# Patient Record
Sex: Female | Born: 1960 | Race: White | Hispanic: No | Marital: Single | Smoking: Never smoker
Health system: Southern US, Community
[De-identification: ages and names within clinical notes are randomized; demographics above are authoritative.]

## PROBLEM LIST (undated history)

## (undated) DIAGNOSIS — E669 Obesity, unspecified: Secondary | ICD-10-CM

## (undated) DIAGNOSIS — D219 Benign neoplasm of connective and other soft tissue, unspecified: Secondary | ICD-10-CM

## (undated) DIAGNOSIS — E785 Hyperlipidemia, unspecified: Secondary | ICD-10-CM

## (undated) DIAGNOSIS — R87619 Unspecified abnormal cytological findings in specimens from cervix uteri: Secondary | ICD-10-CM

## (undated) DIAGNOSIS — D649 Anemia, unspecified: Secondary | ICD-10-CM

## (undated) DIAGNOSIS — F419 Anxiety disorder, unspecified: Secondary | ICD-10-CM

## (undated) DIAGNOSIS — E039 Hypothyroidism, unspecified: Secondary | ICD-10-CM

## (undated) DIAGNOSIS — H9192 Unspecified hearing loss, left ear: Secondary | ICD-10-CM

## (undated) DIAGNOSIS — IMO0002 Reserved for concepts with insufficient information to code with codable children: Secondary | ICD-10-CM

## (undated) HISTORY — DX: Reserved for concepts with insufficient information to code with codable children: IMO0002

## (undated) HISTORY — DX: Unspecified abnormal cytological findings in specimens from cervix uteri: R87.619

## (undated) HISTORY — DX: Benign neoplasm of connective and other soft tissue, unspecified: D21.9

## (undated) HISTORY — DX: Hypothyroidism, unspecified: E03.9

## (undated) HISTORY — DX: Hyperlipidemia, unspecified: E78.5

## (undated) HISTORY — DX: Anemia, unspecified: D64.9

## (undated) HISTORY — DX: Unspecified hearing loss, left ear: H91.92

## (undated) HISTORY — DX: Obesity, unspecified: E66.9

## (undated) HISTORY — DX: Anxiety disorder, unspecified: F41.9

---

## 1998-12-13 HISTORY — PX: OTHER SURGICAL HISTORY: SHX169

## 1998-12-29 ENCOUNTER — Other Ambulatory Visit: Admission: RE | Admit: 1998-12-29 | Discharge: 1998-12-29 | Payer: Self-pay | Admitting: Obstetrics and Gynecology

## 2000-04-09 ENCOUNTER — Other Ambulatory Visit: Admission: RE | Admit: 2000-04-09 | Discharge: 2000-04-09 | Payer: Self-pay | Admitting: Obstetrics and Gynecology

## 2001-04-11 ENCOUNTER — Other Ambulatory Visit: Admission: RE | Admit: 2001-04-11 | Discharge: 2001-04-11 | Payer: Self-pay | Admitting: Obstetrics and Gynecology

## 2002-08-10 ENCOUNTER — Other Ambulatory Visit: Admission: RE | Admit: 2002-08-10 | Discharge: 2002-08-10 | Payer: Self-pay | Admitting: Obstetrics and Gynecology

## 2003-10-29 ENCOUNTER — Other Ambulatory Visit: Admission: RE | Admit: 2003-10-29 | Discharge: 2003-10-29 | Payer: Self-pay | Admitting: Obstetrics and Gynecology

## 2004-11-23 ENCOUNTER — Other Ambulatory Visit: Admission: RE | Admit: 2004-11-23 | Discharge: 2004-11-23 | Payer: Self-pay | Admitting: Obstetrics and Gynecology

## 2005-01-09 ENCOUNTER — Ambulatory Visit: Payer: Self-pay | Admitting: Internal Medicine

## 2005-12-28 ENCOUNTER — Other Ambulatory Visit: Admission: RE | Admit: 2005-12-28 | Discharge: 2005-12-28 | Payer: Self-pay | Admitting: Obstetrics & Gynecology

## 2006-01-02 ENCOUNTER — Ambulatory Visit: Payer: Self-pay | Admitting: Internal Medicine

## 2006-01-28 ENCOUNTER — Emergency Department: Payer: Self-pay | Admitting: Emergency Medicine

## 2006-01-29 ENCOUNTER — Ambulatory Visit: Payer: Self-pay | Admitting: Internal Medicine

## 2006-01-29 ENCOUNTER — Inpatient Hospital Stay: Payer: Self-pay | Admitting: Surgery

## 2006-02-10 HISTORY — PX: OTHER SURGICAL HISTORY: SHX169

## 2006-03-07 ENCOUNTER — Ambulatory Visit: Payer: Self-pay | Admitting: Internal Medicine

## 2006-03-08 ENCOUNTER — Ambulatory Visit: Payer: Self-pay | Admitting: Internal Medicine

## 2006-04-17 ENCOUNTER — Ambulatory Visit: Payer: Self-pay | Admitting: Gastroenterology

## 2006-05-08 ENCOUNTER — Ambulatory Visit: Payer: Self-pay | Admitting: Gastroenterology

## 2007-01-24 ENCOUNTER — Other Ambulatory Visit: Admission: RE | Admit: 2007-01-24 | Discharge: 2007-01-24 | Payer: Self-pay | Admitting: Obstetrics & Gynecology

## 2007-02-04 ENCOUNTER — Ambulatory Visit: Payer: Self-pay | Admitting: Internal Medicine

## 2007-02-11 HISTORY — PX: CHOLECYSTECTOMY, LAPAROSCOPIC: SHX56

## 2007-02-11 HISTORY — PX: APPENDECTOMY: SHX54

## 2007-02-23 ENCOUNTER — Emergency Department: Payer: Self-pay

## 2007-02-23 ENCOUNTER — Other Ambulatory Visit: Payer: Self-pay

## 2007-02-24 ENCOUNTER — Ambulatory Visit: Payer: Self-pay | Admitting: Gastroenterology

## 2007-02-28 ENCOUNTER — Ambulatory Visit: Payer: Self-pay | Admitting: Surgery

## 2007-03-04 ENCOUNTER — Ambulatory Visit: Payer: Self-pay | Admitting: Surgery

## 2007-09-16 ENCOUNTER — Ambulatory Visit: Payer: Self-pay | Admitting: Gynecologic Oncology

## 2008-02-26 ENCOUNTER — Other Ambulatory Visit: Admission: RE | Admit: 2008-02-26 | Discharge: 2008-02-26 | Payer: Self-pay | Admitting: Obstetrics & Gynecology

## 2008-02-27 ENCOUNTER — Ambulatory Visit: Payer: Self-pay | Admitting: Internal Medicine

## 2008-03-08 ENCOUNTER — Ambulatory Visit: Payer: Self-pay | Admitting: Internal Medicine

## 2008-07-28 ENCOUNTER — Ambulatory Visit: Payer: Self-pay | Admitting: Internal Medicine

## 2009-01-21 ENCOUNTER — Ambulatory Visit: Payer: Self-pay | Admitting: Family Medicine

## 2010-01-26 ENCOUNTER — Ambulatory Visit: Payer: Self-pay | Admitting: Internal Medicine

## 2011-02-06 ENCOUNTER — Ambulatory Visit: Payer: Self-pay | Admitting: Family Medicine

## 2011-05-31 ENCOUNTER — Ambulatory Visit: Payer: Self-pay | Admitting: Gastroenterology

## 2012-02-21 ENCOUNTER — Ambulatory Visit: Payer: Self-pay | Admitting: Internal Medicine

## 2013-02-11 ENCOUNTER — Telehealth: Payer: Self-pay | Admitting: Obstetrics & Gynecology

## 2013-02-11 NOTE — Telephone Encounter (Signed)
Pt calling to get a refill for phentermine [email or web address]  [phone]

## 2013-02-12 NOTE — Telephone Encounter (Signed)
Spoke with patient stated she started 1/2 tab of phentermine a couple of weeks ago, she had this left from last rx. Advised we will not refill this-needs to be evaluated prior to restarting this medication. Appointment with Dr Hyacinth Meeker made for 02/17/13.

## 2013-02-17 ENCOUNTER — Other Ambulatory Visit: Payer: Self-pay | Admitting: *Deleted

## 2013-02-17 ENCOUNTER — Encounter: Payer: Self-pay | Admitting: Obstetrics & Gynecology

## 2013-02-17 ENCOUNTER — Ambulatory Visit (INDEPENDENT_AMBULATORY_CARE_PROVIDER_SITE_OTHER): Payer: BC Managed Care – PPO | Admitting: Obstetrics & Gynecology

## 2013-02-17 VITALS — BP 128/80 | HR 64 | Ht 65.75 in | Wt 211.8 lb

## 2013-02-17 DIAGNOSIS — E669 Obesity, unspecified: Secondary | ICD-10-CM

## 2013-02-17 MED ORDER — PHENTERMINE-TOPIRAMATE ER 3.75-23 MG PO CP24: 1.0000 | ORAL_CAPSULE | Freq: Every day | ORAL | Status: DC

## 2013-02-17 MED ORDER — NORETHIN-ETH ESTRAD-FE BIPHAS 1 MG-10 MCG / 10 MCG PO TABS: 1.0000 | ORAL_TABLET | Freq: Every day | ORAL | Status: DC

## 2013-02-17 NOTE — Addendum Note (Signed)
Addended by: Jerene Bears on: 02/17/2013 12:49 PM   Modules accepted: Orders

## 2013-02-17 NOTE — Telephone Encounter (Signed)
Pt called needed rf or samples for lo loestrin, told patient I could call in rf for her; pt agreeable scheduled AEX for 06/05/13. LoLoestrin #1 pack with rf's to July [facility] [place] patient is aware.

## 2013-02-17 NOTE — Patient Instructions (Signed)
We will call and give you and update about the prior authorization for the Qsymia.

## 2013-02-17 NOTE — Progress Notes (Signed)
52 y.o. Single Caucasian female G0P0000 here for discussion of starting weight loss medications if possible.  Was seen by PCP over three months ago and was recommended to start on cholesterol lowering agents.  Patient was to have follow up but canceled because she doesn't want to start medications.  Is exercising two times a week.  She feels that she doesn't have enough time to exercise more because of owrk schedule dna providing care for her father.  (H/O MI and renal cell carcinoma).  She was on Phentermine in the past but never felt like it really helped her in regards to her appetite suppression.  She never had any issues with headache or blood pressure changes.  She and I discussed other treatments/options.  i feel she could benefit from trial of qsymia.  Dosing and risks d/w patient.  Common side effects d/w patient.  She will need to follow-up monthly.  Knows a prior authorization may be needed.   O: ht 5' 5 3/4", wt 211 lbs, bmi 58 Healthy WD,WN female Affect: Normal  A:Obesity, Elevated lipids P:  Qsymia as per RX.  Follow-up one month for recheck.  Will need AEX as well.  Will schedule this as well.  ~15 minutes spent with patient >50% of time was in face to face discussion of above.

## 2013-02-25 ENCOUNTER — Ambulatory Visit: Payer: Self-pay | Admitting: Family Medicine

## 2013-02-26 NOTE — Telephone Encounter (Signed)
Pt is wants to know the status on her refill request

## 2013-03-02 ENCOUNTER — Encounter: Payer: Self-pay | Admitting: *Deleted

## 2013-03-03 ENCOUNTER — Telehealth: Payer: Self-pay | Admitting: *Deleted

## 2013-03-03 NOTE — Telephone Encounter (Signed)
Pt calling for the staus on the weight lost medication pt asking for [facility]

## 2013-03-03 NOTE — Telephone Encounter (Signed)
Chart in your office for prior authorization form to complete and fax back for medication Qsymia.

## 2013-03-06 ENCOUNTER — Institutional Professional Consult (permissible substitution): Payer: Self-pay | Admitting: Obstetrics & Gynecology

## 2013-03-06 ENCOUNTER — Telehealth: Payer: Self-pay | Admitting: *Deleted

## 2013-03-06 NOTE — Telephone Encounter (Signed)
Faxed form from [facility] for Qsymia on chart in your office to fill out for prior authorization. sue

## 2013-03-06 NOTE — Telephone Encounter (Signed)
Informed patient of status of prior authorization for med Qysimia. Chart with Dr. Hyacinth Meeker for another form to be completed and faxed back to [facility].

## 2013-03-06 NOTE — Telephone Encounter (Signed)
Patient aware medication needs prior auth. We will call her as soon as we hear from her insurance.

## 2013-03-12 NOTE — Telephone Encounter (Signed)
Can you call Ms. Kinnison about the Qsymia.  Her pharmacy benefit is requiring that she be seen every month for six months with documentation of weights and exercise program and at least two neg pregnancy tests before the qsymia will be approved.  I don't think she wants to do all of this.  Also, she must have another diagnosis of hypertension, elevated lipids or diabetes for the medication to be covered.  Sorry.  I didn't know it would be this hard.  I've done other prior authorizations but this is the most detailed thus far.

## 2013-03-12 NOTE — Telephone Encounter (Signed)
PATIENT NOTIFIED OF RESPONSE FROM DR. MILLER OF MEDICATION QSYMIA AND REQUIREMENTS FOR PRIOR APPROVAL FROM INSURANCE. PATIENT UNDERSTANDS AND IS APPRECIATIVE OF OUR TRYING TO DO THIS. PATIENT IS ASKING IF YOU CAN PRESCRIBE FOR HER PHENTERMINE . STATES SHE HAS HAD BEFORE. PLEASE ADVISE. Sharon Hicks

## 2013-03-13 ENCOUNTER — Telehealth: Payer: Self-pay | Admitting: *Deleted

## 2013-03-13 MED ORDER — PHENTERMINE HCL 37.5 MG PO CAPS: 37.5000 mg | ORAL_CAPSULE | Freq: Every day | ORAL | Status: DC

## 2013-03-13 NOTE — Telephone Encounter (Signed)
OK to call in phentermine 37.5mg  qd.  #30/1RF.  Needs f/u six weeks for weight check.  Needs to be called in or faxed.  Thanks.

## 2013-03-13 NOTE — Telephone Encounter (Signed)
Patient notified on Rx sent [facility] and need for f/u appt. With Dr. Hyacinth Meeker. Appt. Scheduled in June 10th.

## 2013-03-13 NOTE — Addendum Note (Signed)
Addended by: Elnora Morrison on: 03/13/2013 03:07 PM   Modules accepted: Orders

## 2013-04-21 ENCOUNTER — Ambulatory Visit: Payer: BC Managed Care – PPO | Admitting: Obstetrics & Gynecology

## 2013-06-04 ENCOUNTER — Encounter: Payer: Self-pay | Admitting: Obstetrics & Gynecology

## 2013-06-05 ENCOUNTER — Encounter: Payer: Self-pay | Admitting: Obstetrics & Gynecology

## 2013-06-05 ENCOUNTER — Ambulatory Visit (INDEPENDENT_AMBULATORY_CARE_PROVIDER_SITE_OTHER): Payer: BC Managed Care – PPO | Admitting: Obstetrics & Gynecology

## 2013-06-05 VITALS — BP 116/78 | HR 60 | Resp 16 | Ht 66.0 in | Wt 193.8 lb

## 2013-06-05 DIAGNOSIS — Z124 Encounter for screening for malignant neoplasm of cervix: Secondary | ICD-10-CM

## 2013-06-05 DIAGNOSIS — Z01419 Encounter for gynecological examination (general) (routine) without abnormal findings: Secondary | ICD-10-CM

## 2013-06-05 MED ORDER — NORETHIN-ETH ESTRAD-FE BIPHAS 1 MG-10 MCG / 10 MCG PO TABS: 1.0000 | ORAL_TABLET | Freq: Every day | ORAL | Status: DC

## 2013-06-05 NOTE — Progress Notes (Signed)
51 y.o. G0P0000 SingleCaucasianF here for annual exam.  Father's urothelial cancer has returned and is in his lungs.  Going to [facility] and back on chemotherapy.  Special cat died this spring.  Patient has struggled with issues of being alone.  No cycles except for 03/12/13.  This was a short cycle for her.    Bloodwork in May was normal.    Patient's last menstrual period was 03/12/2013.          Sexually active: no  The current method of family planning is OCP (estrogen/progesterone).    Exercising: yes  walking and weights Smoker:  no  Health Maintenance: Pap:  06/04/12 WNL History of abnormal Pap:  yes MMG:  3/14 [facility] Colonoscopy:  2011 repeat in 10 years, at [place] regional BMD:   2012 TDaP:  2012 Screening Labs: PCP, Hb today: PCP, Urine today: PCP   reports that she has never smoked. She has never used smokeless tobacco. She reports that she drinks about 0.5 ounces of alcohol per week. She reports that she does not use illicit drugs.  Past Medical History  Diagnosis Date  . Fibroids   . Hypothyroidism   . Elevated lipids   . Abnormal Pap smear     h/o ascus-h pap, colpo negative  . Anemia     h/o  . Anxiety     stressors with fahter aging  . Obesity     Past Surgical History  Procedure Laterality Date  . Excision of thyroid nodule  2/00  . Cholecystectomy, laparoscopic  4/08  . Appendectomy  4/08  . Cardiac evaluation  4/07    stress test, echo, EKG-mild MVP    Current Outpatient Prescriptions  Medication Sig Dispense Refill  . BLACK COHOSH PO Take by mouth.      . Calcium Carbonate (CALCIUM 600 PO) Take by mouth daily.      . Cholecalciferol (VITAMIN D PO) Take by mouth daily.      . clonazePAM (KLONOPIN) 0.5 MG tablet Take 0.5 mg by mouth as needed for anxiety.      Tery Sanfilippo Calcium (STOOL SOFTENER PO) Take by mouth.      Marland Kitchen FLUoxetine (PROZAC) 10 MG capsule Take 10 mg by mouth. Take 30mg  daily      . levothyroxine (SYNTHROID,  LEVOTHROID) 125 MCG tablet Take 125 mcg by mouth daily before breakfast.      . MELATONIN PO Take by mouth daily.      . Norethindrone-Ethinyl Estradiol-Fe Biphas (LO LOESTRIN FE) 1 MG-10 MCG / 10 MCG tablet Take 1 tablet by mouth daily.  1 Package  3  . Omega-3 Fatty Acids (FISH OIL PO) Take by mouth.      . sertraline (ZOLOFT) 50 MG tablet Take 50 mg by mouth daily.      . phentermine 37.5 MG capsule Take 1 capsule (37.5 mg total) by mouth daily. Follow up with DrHyacinth Meeker in six weeks.  30 capsule  1   No current facility-administered medications for this visit.    Family History  Problem Relation Age of Onset  . Alzheimer's disease Mother 33    died  . CVA Mother   . Heart attack Father   . Kidney cancer Father   . Hypertension Father   . Hyperlipidemia Father   . Cancer Father     reoccurred 03/19/13/mets to lungs, adrenal gland, and some lymph nodes    ROS:  Pertinent items are noted in HPI.  Otherwise,  a comprehensive ROS was negative.  Exam:   BP 116/78  Pulse 60  Resp 16  Ht 5\' 6"  (1.676 m)  Wt 193 lb 12.8 oz (87.907 kg)  BMI 31.3 kg/m2  LMP 03/12/2013  Weight change: -12lbs   Height: 5\' 6"  (167.6 cm)  Ht Readings from Last 3 Encounters:  06/05/13 5\' 6"  (1.676 m)  02/17/13 5' 5.75" (1.67 m)    General appearance: alert, cooperative and appears stated age Head: Normocephalic, without obvious abnormality, atraumatic Neck: no adenopathy, supple, symmetrical, trachea midline and thyroid normal to inspection and palpation Lungs: clear to auscultation bilaterally Breasts: normal appearance, no masses or tenderness Heart: regular rate and rhythm Abdomen: soft, non-tender; bowel sounds normal; no masses,  no organomegaly Extremities: extremities normal, atraumatic, no cyanosis or edema Skin: Skin color, texture, turgor normal. No rashes or lesions Lymph nodes: Cervical, supraclavicular, and axillary nodes normal. No abnormal inguinal nodes palpated Neurologic: Grossly  normal   Pelvic: External genitalia:  no lesions              Urethra:  normal appearing urethra with no masses, tenderness or lesions              Bartholins and Skenes: normal                 Vagina: normal appearing vagina with normal color and discharge, no lesions              Cervix: no lesions              Pap taken: yes Bimanual Exam:  Uterus:  About 10-12 weeks in size, globular consistent with fibroids              Adnexa: normal adnexa and no mass, fullness, tenderness               Rectovaginal: Confirms               Anus:  normal sphincter tone, no lesions  A:  Well Woman with normal exam H/o ASCUS-H 6/12 H/O fibroids Elevated lipids  P:   Mammogram at [facility] 3/14.  Release signed. pap smear with HR HPV today Labs with PCP Continue on Loloestrin for now as patient has so much going on in her life. return annually or prn  An After Visit Summary was printed and given to the patient.

## 2013-06-05 NOTE — Patient Instructions (Addendum)

## 2013-06-09 LAB — IPS PAP TEST WITH HPV

## 2014-02-24 ENCOUNTER — Ambulatory Visit: Payer: Self-pay | Admitting: Family Medicine

## 2014-04-14 DIAGNOSIS — E785 Hyperlipidemia, unspecified: Secondary | ICD-10-CM | POA: Insufficient documentation

## 2014-04-14 DIAGNOSIS — F32A Depression, unspecified: Secondary | ICD-10-CM | POA: Insufficient documentation

## 2014-04-14 DIAGNOSIS — E039 Hypothyroidism, unspecified: Secondary | ICD-10-CM | POA: Insufficient documentation

## 2014-04-14 DIAGNOSIS — M25519 Pain in unspecified shoulder: Secondary | ICD-10-CM | POA: Insufficient documentation

## 2014-04-14 DIAGNOSIS — F329 Major depressive disorder, single episode, unspecified: Secondary | ICD-10-CM | POA: Insufficient documentation

## 2014-06-10 ENCOUNTER — Other Ambulatory Visit: Payer: Self-pay | Admitting: Obstetrics & Gynecology

## 2014-06-11 NOTE — Telephone Encounter (Signed)
Last AEX and refill 06/05/13 #1pack/ 13 refills Next appt 08/12/14  Rx sent to last until appt 10/15

## 2014-07-27 ENCOUNTER — Encounter: Payer: Self-pay | Admitting: Obstetrics & Gynecology

## 2014-07-27 ENCOUNTER — Telehealth: Payer: Self-pay | Admitting: Obstetrics & Gynecology

## 2014-07-27 NOTE — Telephone Encounter (Signed)
LMTCB APPOINTMENT CX

## 2014-08-12 ENCOUNTER — Ambulatory Visit: Payer: BC Managed Care – PPO | Admitting: Obstetrics & Gynecology

## 2014-09-02 ENCOUNTER — Other Ambulatory Visit: Payer: Self-pay | Admitting: *Deleted

## 2014-09-02 NOTE — Telephone Encounter (Addendum)
[facility] 1/10 Last Refilled: 06/11/14 #28/2 refills  Aex Scheduled: 10/06/14 with Dr. Sabra Heck  Last Mammogram: 02/25/13 Bi-Rads 1: Negative  Please advise.

## 2014-09-03 MED ORDER — NORETHIN-ETH ESTRAD-FE BIPHAS 1 MG-10 MCG / 10 MCG PO TABS: ORAL_TABLET | ORAL | Status: DC

## 2014-09-07 NOTE — Telephone Encounter (Signed)
[facility] been [facility].

## 2014-09-22 ENCOUNTER — Emergency Department: Payer: Self-pay | Admitting: Emergency Medicine

## 2014-09-27 ENCOUNTER — Telehealth: Payer: Self-pay | Admitting: Obstetrics & Gynecology

## 2014-09-27 NOTE — Telephone Encounter (Signed)
Encounter closed in error. Call to patient. She fell down the stair and has 5 staples in the back of her head on the right side.  She was told to have these removed by PCP or return to ER. Patient's PCP refuses to remove and local urgent care in [facility] also refuses. Advised patient that as GYN office, this is outside scope for our office. Recommend she try the [facility] urgent care on [address] or may have to return to ED.  Routing to provider for final review. Patient agreeable to disposition. Will close encounter

## 2014-09-27 NOTE — Telephone Encounter (Signed)
Sharon Hicks went to the ER last week and will need to have her staples removed from her head. Sharon Hicks is asking if Dr. Sabra Heck would remove the at her appointment next week?

## 2014-10-06 ENCOUNTER — Encounter: Payer: Self-pay | Admitting: Obstetrics & Gynecology

## 2014-10-06 ENCOUNTER — Ambulatory Visit (INDEPENDENT_AMBULATORY_CARE_PROVIDER_SITE_OTHER): Payer: BC Managed Care – PPO | Admitting: Obstetrics & Gynecology

## 2014-10-06 VITALS — BP 140/70 | HR 72 | Resp 16 | Ht 66.0 in | Wt 213.0 lb

## 2014-10-06 DIAGNOSIS — D251 Intramural leiomyoma of uterus: Secondary | ICD-10-CM

## 2014-10-06 DIAGNOSIS — N912 Amenorrhea, unspecified: Secondary | ICD-10-CM

## 2014-10-06 DIAGNOSIS — Z0000 Encounter for general adult medical examination without abnormal findings: Secondary | ICD-10-CM

## 2014-10-06 DIAGNOSIS — Z124 Encounter for screening for malignant neoplasm of cervix: Secondary | ICD-10-CM

## 2014-10-06 DIAGNOSIS — R312 Other microscopic hematuria: Secondary | ICD-10-CM

## 2014-10-06 DIAGNOSIS — Z01419 Encounter for gynecological examination (general) (routine) without abnormal findings: Secondary | ICD-10-CM

## 2014-10-06 DIAGNOSIS — R3129 Other microscopic hematuria: Secondary | ICD-10-CM

## 2014-10-06 LAB — POCT URINALYSIS DIPSTICK
Leukocytes, UA: NEGATIVE
PH UA: 5
Urobilinogen, UA: NEGATIVE

## 2014-10-06 NOTE — Progress Notes (Signed)
PUS scheduled for 10-21-14 at 4pm. Patient agreeable to date and time.
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
## 2014-10-07 LAB — URINALYSIS, ROUTINE W REFLEX MICROSCOPIC
BILIRUBIN URINE: NEGATIVE
GLUCOSE, UA: NEGATIVE mg/dL
HGB URINE DIPSTICK: NEGATIVE
KETONES UR: NEGATIVE mg/dL
LEUKOCYTES UA: NEGATIVE
Nitrite: NEGATIVE
PH: 5.5 (ref 5.0–8.0)
Protein, ur: NEGATIVE mg/dL
Specific Gravity, Urine: >1.03 — ABNORMAL HIGH (ref 1.005–1.030)
Urobilinogen, UA: 0.2 mg/dL (ref 0.0–1.0)

## 2014-10-07 LAB — URINALYSIS, MICROSCOPIC ONLY
Bacteria, UA: NONE SEEN
CASTS: NONE SEEN
Crystals: NONE SEEN

## 2014-10-07 LAB — FOLLICLE STIMULATING HORMONE: FSH: 46.9 m[IU]/mL

## 2014-10-11 ENCOUNTER — Telehealth: Payer: Self-pay

## 2014-10-11 ENCOUNTER — Telehealth: Payer: Self-pay | Admitting: Obstetrics & Gynecology

## 2014-10-11 NOTE — Telephone Encounter (Signed)
Left message for patient to call back. Need to go over benefits for PUS scheduled 12.10.2015 PR $91.85

## 2014-10-11 NOTE — Telephone Encounter (Signed)
Pt returning call

## 2014-10-11 NOTE — Telephone Encounter (Signed)
Lmtcb//kn 

## 2014-10-11 NOTE — Telephone Encounter (Signed)
-----   Message from Lyman Speller, MD sent at 10/09/2014  7:07 AM EST ----- Inform [facility] almost 50.  Doesn't need to restart OCPs.

## 2014-10-12 LAB — IPS PAP TEST WITH REFLEX TO HPV

## 2014-10-12 NOTE — Telephone Encounter (Signed)
Patient notified of results. See result note.//kn 

## 2014-10-21 ENCOUNTER — Ambulatory Visit (INDEPENDENT_AMBULATORY_CARE_PROVIDER_SITE_OTHER): Payer: BC Managed Care – PPO | Admitting: Obstetrics & Gynecology

## 2014-10-21 ENCOUNTER — Ambulatory Visit (INDEPENDENT_AMBULATORY_CARE_PROVIDER_SITE_OTHER): Payer: BC Managed Care – PPO

## 2014-10-21 VITALS — BP 126/78 | Ht 66.0 in | Wt 215.0 lb

## 2014-10-21 DIAGNOSIS — D251 Intramural leiomyoma of uterus: Secondary | ICD-10-CM

## 2014-10-21 DIAGNOSIS — N852 Hypertrophy of uterus: Secondary | ICD-10-CM

## 2014-10-21 NOTE — Progress Notes (Signed)
53 y.o. G0 SWF here for PUS due to enlarged uterus in patient with history of fibroids.  I haven't examined pt in over a year due to personal stressors/death in family.  On exam October 17, 2014, I felt uterus was enlarged from prior exam but it has been awhile and she has known fibroids.  I felt ultrasound would be useful for more accurate comparison.  Pt has stopped OCPs.  No side effects right now since coming off.  No vaignal bleeding.  She is aware she needs to call if has any.   Patient's last menstrual period was 07/06/2014.  Sexually active:  no  Contraception: no method  FINDINGS: UTERUS: 9.7 x 9.3 x 7.3cm (this is smaller than last PUS on 06/04/12 with measurements of 10.9 x 9.7 x 9.8cm).  Multiple fibroids measured today:  2.0cm, 2.5cm, 2.3cm, 1.9cm, 3.5cm, 5.3cm, 6.5cm.  Two of these are larger than prior imaging (the 5.3 was 4.1cm and the 6.5 was 6.0cm) EMS:  Difficult to evaluate due to fibroids ADNEXA:   Left ovary not clearly seen due to fibroids but no obvious masses   Right ovary not clearly seen, same as the left ovary, but no obvious masses CUL DE SAC: no free fluid   Images reveiwed with pt and comparison made to prior ultrasound 7/13.  Pt does feel symptomatic due to size of uterus with pressure and urinary complaints.  Has urinary incontinence.  She is considering hysterectomy but really wants to proceed with laparoscopic approach.  Due to age, uterus should not be cut due to risk of occult cancer (which is very low but still present).  Pt will need a TAH and she is reluctant to do this at this time.  I do feel uterus will become smaller due to being off hormonal therapy if she will give it some time.  Pt willing to do this.  Would like to go ahead and proceed with urodynamics testing if possible right now as has met deductible.  Will see if can possibly get scheduled now.  Plan repeat PUS 6 months.  Assessment:  Symptomatic uterine fibroids due to size and pressure Recent cessation of  low dose OCP Stress urinary incontinence  Plan: Urodynamics.  Will need appt with Dr. Quincy Simmonds Repeat PUS 6 months.  Order placed.  ~30 minutes spent with patient >50% of time was in face to face discussion of above.

## 2014-10-24 ENCOUNTER — Encounter: Payer: Self-pay | Admitting: Obstetrics & Gynecology

## 2014-11-10 ENCOUNTER — Telehealth: Payer: Self-pay | Admitting: *Deleted

## 2014-11-10 NOTE — Telephone Encounter (Signed)
Call to patient. Consult scheduled with Dr Quincy Simmonds for 12-17-14 at 0930 to discuss bladder issues and possible urodynamic testing. Patient declines to schedule testing until meeting with Dr Quincy Simmonds.Very anxious about testing procedure. Brief description given.and she will discuss further with Dr Quincy Simmonds. She has eye surgery scheduled so declines earlier appointment.  Routing to provider for final review. Patient agreeable to disposition. Will close encounter  CC: Dr Quincy Simmonds

## 2014-11-12 HISTORY — PX: EYE SURGERY: SHX253

## 2014-11-26 DIAGNOSIS — H18459 Nodular corneal degeneration, unspecified eye: Secondary | ICD-10-CM | POA: Insufficient documentation

## 2014-12-17 ENCOUNTER — Ambulatory Visit (INDEPENDENT_AMBULATORY_CARE_PROVIDER_SITE_OTHER): Payer: BLUE CROSS/BLUE SHIELD | Admitting: Obstetrics and Gynecology

## 2014-12-17 ENCOUNTER — Telehealth: Payer: Self-pay | Admitting: Obstetrics and Gynecology

## 2014-12-17 ENCOUNTER — Encounter: Payer: Self-pay | Admitting: Obstetrics and Gynecology

## 2014-12-17 VITALS — BP 120/74 | HR 70 | Ht 66.0 in | Wt 215.8 lb

## 2014-12-17 DIAGNOSIS — N3946 Mixed incontinence: Secondary | ICD-10-CM

## 2014-12-17 NOTE — Progress Notes (Signed)
Patient ID: Sharon Hicks, female   DOB: 01/12/1961, 54 y.o.   MRN: [id number] GYNECOLOGY VISIT  PCP: Fulton Reek, MD  Referring provider:   HPI: 54 y.o.   Single  Caucasian  female   G0P0000 with Patient's last menstrual period was 10/17/2014 (exact date).  - spotting. here for Stress urinary incontinence.  Incontinence for 2 -3 years. Leaks sometimes with cough, laugh, or sneeze.  Leaks about 50% of the time.  No leakage with exercise or heavy lifting.   If needs to void, has difficulty maintaining continence.  Kegel's do not stop the incontinence.  DF - q 3.  NF - once per hs.  Enuresis 2 times.   No hematuria or dysuria.  Couple of UTIs in the recent years. No pyelonephritis or stones.   Voiding with straining at the end of stream to complete void.   No fecal incontinence or constipation.   Known uterine fibroids.  Planning on a hysterectomy with Dr. Sabra Heck. Off OCPs now. Some hot flashes.   GYNECOLOGIC HISTORY: Patient's last menstrual period was 10/17/2014 (exact date). Sexually active: no  Partner preference: female Contraception:  OCP's  Menopausal hormone therapy: none DES exposure:  no Blood transfusions:  no  Sexually transmitted diseases:  no GYN procedures and prior surgeries:  none Last mammogram: 01/2014 [id number]                 Last pap and high risk HPV testing:10-06-14 ascus:neg HR HPV    History of abnormal pap smear: yes asc-h pap 12-18-11 with colposcopy neg.   OB History    Gravida Para Term Preterm AB TAB SAB Ectopic Multiple Living   0 0 0 0 0 0 0 0 0 0        LIFESTYLE: Exercise:               Tobacco: no Alcohol:   rarely Drug use:  no  Patient Active Problem List   Diagnosis Date Noted  . Arthralgia of shoulder 04/14/2014  . Clinical depression 04/14/2014  . HLD (hyperlipidemia) 04/14/2014  . Adult hypothyroidism 04/14/2014    Past Medical History  Diagnosis Date  . Fibroids   . Hypothyroidism    . Elevated lipids   . Abnormal Pap smear     h/o ascus-h pap, colpo negative  . Anemia     h/o  . Anxiety     stressors with fahter aging  . Obesity     Past Surgical History  Procedure Laterality Date  . Excision of thyroid nodule  2/00  . Cholecystectomy, laparoscopic  4/08  . Appendectomy  4/08  . Cardiac evaluation  4/07    stress test, echo, EKG-mild MVP  . Eye surgery Right 11/2014    Current Outpatient Prescriptions  Medication Sig Dispense Refill  . Calcium Carbonate (CALCIUM 600 PO) Take by mouth daily.    . Cholecalciferol (D-5000) 5000 UNITS TABS Take by mouth daily.    . Cholecalciferol (VITAMIN D PO) Take by mouth daily.    . Cyanocobalamin (RA VITAMIN B-12 TR) 1000 MCG TBCR Take by mouth daily.    Marland Kitchen FLUoxetine (PROZAC) 10 MG capsule Take 10 mg by mouth. Take 30mg  daily    . levothyroxine (SYNTHROID) 112 MCG tablet Take by mouth daily.    Marland Kitchen levothyroxine (SYNTHROID, LEVOTHROID) 125 MCG tablet Take 125 mcg by mouth daily before breakfast.    . MELATONIN PO Take by mouth daily.    . Omega-3 Fatty Acids (FISH  OIL PO) Take by mouth.    . prednisoLONE acetate (PRED FORTE) 1 % ophthalmic suspension   0   No current facility-administered medications for this visit.     ALLERGIES: Codeine  Family History  Problem Relation Age of Onset  . Alzheimer's disease Mother 34    died  . CVA Mother   . Heart attack Father   . Kidney cancer Father   . Hypertension Father   . Hyperlipidemia Father   . Cancer Father     reoccurred 03/19/13/mets to lungs, adrenal gland, and some lymph nodes    History   Social History  . Marital Status: Single    Spouse Name: N/A    Number of Children: N/A  . Years of Education: N/A   Occupational History  . Not on file.   Social History Main Topics  . Smoking status: Never Smoker   . Smokeless tobacco: Never Used  . Alcohol Use: 0.6 - 1.2 oz/week    1-2 Not specified per week     Comment: rarely  . Drug Use: No  . Sexual  Activity: No   Other Topics Concern  . Not on file   Social History Narrative    ROS:  Pertinent items are noted in HPI.  PHYSICAL EXAMINATION:    BP 120/74 mmHg  Pulse 70  Ht 5\' 6"  (1.676 m)  Wt 215 lb 12.8 oz (97.886 kg)  BMI 34.85 kg/m2  LMP 10/17/2014 (Exact Date)   Wt Readings from Last 3 Encounters:  12/17/14 215 lb 12.8 oz (97.886 kg)  10/21/14 215 lb (97.523 kg)  10/06/14 213 lb (96.616 kg)     Ht Readings from Last 3 Encounters:  12/17/14 5\' 6"  (1.676 m)  10/21/14 5\' 6"  (1.676 m)  10/06/14 5\' 6"  (1.676 m)    General appearance: alert, cooperative and appears stated age   Abdomen: soft, non-tender;  14 week size mass,  no organomegaly   Pelvic: External genitalia:  no lesions              Urethra:  normal appearing urethra with no masses, tenderness or lesions              Bartholins and Skenes: normal                 Vagina: normal appearing vagina with normal color and discharge, no lesions, no cystocele.               Cervix: normal appearance                 Bimanual Exam:  Uterus:  uterus is 14 week size and nontender                                      Adnexa: normal adnexa in size, nontender and no masses                                      Rectovaginal: Confirms                                      Anus:  normal sphincter tone, no lesions  ASSESSMENT  Mixed incontinence.   Uterine fibroids.  No significant prolapse of uterus,  bladder or rectum noted.   PLAN  Discussion regarding mixed incontinence, urodynamics, and midurethral sling/cystoscopy.   [facility] handouts on urinary incontinence in general and surgical treatment of incontinence to patient.  I believe that her fibroids are partially contributing to her stress incontinence, but she understands that I am unable to predict if she would be 100% dry after hysterectomy only.  Urodynamics explained.  It does not determine if the fibroids are the cause of the incontinence.  It is a test that  measures pressures in the bladder with filling, holding, and emptying/leaking of fluid/urine.  Will precert this study.  Permanent nature of the mesh discussed along with mesh issues.  Patient will consider her options and call back.   An After Visit Summary was printed and given to the patient.  25 minutes face to face time of which over 50% was spent in counseling.

## 2014-12-17 NOTE — Telephone Encounter (Signed)
Spoke with patient. Patient states that she was in the office today with Dr.Silva to discuss surgery options. Patient would like to know if "The bladder portion of the surgery we talked about today is the same thing that many people refer to as tacking the bladder?" Advised would speak with Dr.Silva regarding surgery question and return call with further information. Patient is agreeable.

## 2014-12-17 NOTE — Telephone Encounter (Signed)
Patient has one more question for Dr. Quincy Simmonds. She had an appointment this morning and forgot to ask her this question.

## 2014-12-19 NOTE — Telephone Encounter (Signed)
We discussed a midurethral sling for probable genuine stress incontinence.  This is a urethral tube procedure and not a bladder tack. Patient did not show signs of a dropped bladder requiring a support or "tacking" procedure.  I hope this is helpful.   Patient is planning for potential urodynamic testing.  We can discuss further for the follow up visit for this procedure or at any time if she would like to have another visit to discuss and review further.   I gave her handouts form [employer] about urinary incontinence.  Reading these may help her to understand further.   Thanks!

## 2014-12-20 NOTE — Telephone Encounter (Signed)
Spoke with patient. Advised patient of message as seen below from [place]. Patient is agreeable. Patient states "I went to see Dr.Silva because Dr.Miller recommended that I see her before deciding on a hysterectomy. Now that I have seen her I would like to talk to [place] about her opinion." Offered patient appointment with Dr.Miller but patient declines. Requesting a phone call from [place]. Advised patient Dr.Miller is in surgery this morning and seeing patient's this afternoon. Advised will send a message over to [place] with request so that a return call may be made at the end of office hours or between patients. Patient is agreeable.

## 2014-12-20 NOTE — Telephone Encounter (Signed)
Left message to call Sharon Hicks at [phone]. 

## 2014-12-20 NOTE — Telephone Encounter (Signed)
Pt returning call

## 2014-12-21 NOTE — Telephone Encounter (Signed)
Spoke with patient. Advised that per benefit quote received, she will be responsible to pay $269.08 when she comes in for urodynamics testing. Patient declines scheduling at this time. States that she is waiting to hear from Dr Sharon Hicks.

## 2014-12-31 ENCOUNTER — Telehealth: Payer: Self-pay | Admitting: Obstetrics & Gynecology

## 2014-12-31 MED ORDER — ESZOPICLONE 3 MG PO TABS: ORAL_TABLET | ORAL | Status: DC

## 2014-12-31 NOTE — Telephone Encounter (Signed)
Patient calling wanting to discuss recent testing results with Dr. Sabra Heck. She also said, "Since I have gone off my birth control pills I am having trouble sleeping and can't stay asleep all night. I am hot and also a raving maniac. I need some advice."

## 2014-12-31 NOTE — Telephone Encounter (Signed)
Appt with Dr. Quincy Simmonds was for further evaluation of the bladder/incontinence.  I don't do these surgeries.    Lunesta 3mg  qhs prn insomnia is fine.  #30/0RF.  Pt having menopausal symptoms due to stopping OCPs when [facility] was 45.  This is appropriate.  Starting HRT is just going to make her fibroids larger.  I really think she needs to proceed with the urodyanmic testing and decide about surgery--most likely going to be a TAH.

## 2014-12-31 NOTE — Telephone Encounter (Signed)
See additional triage note.  Encounter closed.

## 2014-12-31 NOTE — Telephone Encounter (Signed)
Spoke with patient. Patient states that she was taken off of birth control to "try to shrink my fibroids. I think it has helped because I feel like I can hold my bladder more. I am having hot flashes and night sweats so bad that I can not sleep. I have tried to take melatonin and it is not helping."  Patient is requesting advice from [place]. "I can not take Lorrin Mais if she thinks sleeping medicine may help. I have tried [place] and it worked well for me in the past." Advised patient will need to speak with Dr.Miller and return call with further recommendations. Patient is agreeable. "I also called because I had an appointment with Dr.Silva that Dr.Miller recommended and I would like to speak with her about what to do from here. I want her opinion." Patient requesting personal call from [place]. Declines appointment at this time. Advised will let Dr.Miller know but she is seeing patient's today and I can not guarantee time of return call. Patient is agreeable.

## 2014-12-31 NOTE — Telephone Encounter (Signed)
Rx faxed to [facility] [phone].

## 2014-12-31 NOTE — Telephone Encounter (Signed)
Call to patient. Advised of recommendations as directed by Dr  Sabra Heck.  Reviewed urodynamic testing, answered questions, reviewed recommendations from Dr Quincy Simmonds and Dr Sabra Heck, previous office notes. States was confused about reason to proceed with urodynamic testing if cant be sure this would answer definitively diagnose cause of leaking.  Also asked about potential that hysterectomy alone would not fully relieve symptoms. See Dr Elza Rafter office note from 12-17-14. Brief explanation that although size of uterus would indicate that is cause of leaking, cannot guarantee it is only cause or the degree of success of surgery. Additional testing gives best chance to diagnosis any other potential issues that may need treatment in effort to avoid future procedures. Offered consult appointment if additional questions. Patient declines, states she feels better about proceeding with testing. Scheduled for 02-02-15 at 230. Again offered consult appointment with Dr Sabra Heck, declines. Call prn.  Routing to provider for final review. Patient agreeable to disposition. Will close encounter

## 2015-01-25 ENCOUNTER — Telehealth: Payer: Self-pay | Admitting: Emergency Medicine

## 2015-01-25 NOTE — Telephone Encounter (Signed)
Message left to return call to [place] at [phone].   Patient scheduled for urodynamics testing 01/04/15. Needs UA prior.  Will discuss with patient pre-procedure instructions.

## 2015-01-28 NOTE — Telephone Encounter (Signed)
Spoke with patient and discussed urodynamics procedure and pre-procedural plans. Patient needs to have UA completed prior to urodynamics. She lives and works in [place] so cannot come early in morning for UA prior. She would like to have urinalysis prior to procedure. Patient is aware that procedure may be cancelled if any signs of infection in urine. 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 Advised would need urinalysis with reflex to micro and culture if indicated. She will discuss with NP and call back and fax results to our office. She will let our office know if needs to come in early for urinalysis on 02/02/15 before .

## 2015-01-31 NOTE — Telephone Encounter (Signed)
If urinalysis is completely negative, please proceed with urodynamic testing.

## 2015-01-31 NOTE — Telephone Encounter (Signed)
Patient faxed [facility] results from 01/28/15 with negative urinalysis. Will place in Dr. Elza Rafter results for review.

## 2015-02-02 ENCOUNTER — Ambulatory Visit (INDEPENDENT_AMBULATORY_CARE_PROVIDER_SITE_OTHER): Payer: BLUE CROSS/BLUE SHIELD | Admitting: Obstetrics and Gynecology

## 2015-02-02 ENCOUNTER — Encounter: Payer: Self-pay | Admitting: Obstetrics and Gynecology

## 2015-02-02 DIAGNOSIS — N3946 Mixed incontinence: Secondary | ICD-10-CM

## 2015-02-02 NOTE — Progress Notes (Signed)
Sharon Hicks is a 54 y.o. female Who presents today for urodynamics testing, ordered by Dr. Quincy Simmonds. Patient referred to Dr. Quincy Simmonds by Dr. Sabra Heck.   Allergies and medications reviewed.  Denies complaints today. No urinary complaints.   Urine Micro exam: negative for WBC's or RBC's, no nitrates. Done by [employer] 01/28/15. Okay to proceed per Dr. Quincy Simmonds.  Patient reports urinary leakage with coughing, sneezing, exercise.   Urodynamics testing initiated. Lumax Bladder Catheter #10 [place] and lumax Abdominal Catheter #10 [place].   Post void residual 20 ml.   Urethral catheter placed without issue. Rectal catheter placed without issue.   Urodynamics testing completed. Please see scanned Patient summary report in Epic. Procedure completed and patient tolerated well without complaints. Patient scheduled for follow up office visit with Dr. Quincy Simmonds to discuss results. Patient agreeable.   Patient given post procedure instructions and verbalized understanding:  You may have a mild bladder and rectal discomfort for a few hours after the test. You may experience some frequent urination and slight burning the first few times you urinate after the test. Rarely, the urine may be blood tinged. These are both due to catheter placements and resolve quickly. You should call our office immediately if you have signs of infection, which may include bladder pain, urinary urgency, fever, or burning during urination. We do encourage you to drink plenty of water after the test.

## 2015-02-02 NOTE — Patient Instructions (Signed)
.   You may have a mild bladder and rectal discomfort for a few hours after the test. . You may experience some frequent urination and slight burning the first few times you urinate after the test. Rarely, the urine may be blood tinged. These are both due to catheter placements and resolve quickly.  . You should call our office immediately if you have signs of infection, which may include bladder pain, urinary urgency, fever, or burning during urination. .  We do encourage you to drink plenty of water after completion of the test today.  Please call our office with any questions or concerns.   [place] [address] [place], [place] [address] [facility] [address]:  [phone]; Fax:  [phone]

## 2015-02-03 ENCOUNTER — Other Ambulatory Visit: Payer: Self-pay | Admitting: Obstetrics & Gynecology

## 2015-02-03 MED ORDER — ESZOPICLONE 3 MG PO TABS: ORAL_TABLET | ORAL | Status: DC

## 2015-02-03 NOTE — Telephone Encounter (Signed)
Patient calling requesting refills on her "sleeping medicine." Pharmacy on file is correct.

## 2015-02-03 NOTE — Telephone Encounter (Signed)
Medication refill request: Eszopiclone 3 mg  Last AEX: 10/06/14 with SM Next AEX: 12/12/15 with SM Last MMG (if hormonal medication request): N/A Refill authorized: #[id number] rfs? ; please advise.

## 2015-02-08 NOTE — Telephone Encounter (Signed)
Rx printed and has been faxed to pharmacy.//kn

## 2015-02-10 ENCOUNTER — Encounter: Payer: Self-pay | Admitting: Obstetrics and Gynecology

## 2015-02-10 NOTE — Progress Notes (Signed)
Encounter reviewed by Dr. Josefa Half.  Multichannel urodynamic testing   Uroflow study - Void 393 cc.  PRV 20 cc.  Continuous pattern. CMG - S1 243 cc, S2 501 cc, S3 724 cc.   Stable CMG.  Lots of artifact.  LPP 78 cm H2O at 725 cc.  LPP 95 at 539 cc. UPP - 33 cm H20. Pressure flow study - Max det pressure 20 cm H20.  Voided 653 cc of 724 cc. Valsalva at the end of the void.   Evidence of genuine stress incontinence.  Large bladder volume.

## 2015-02-14 ENCOUNTER — Ambulatory Visit (INDEPENDENT_AMBULATORY_CARE_PROVIDER_SITE_OTHER): Payer: BLUE CROSS/BLUE SHIELD | Admitting: Obstetrics and Gynecology

## 2015-02-14 ENCOUNTER — Encounter: Payer: Self-pay | Admitting: Obstetrics and Gynecology

## 2015-02-14 VITALS — BP 102/64 | HR 66 | Ht 66.0 in | Wt 215.0 lb

## 2015-02-14 DIAGNOSIS — N393 Stress incontinence (female) (male): Secondary | ICD-10-CM | POA: Diagnosis not present

## 2015-02-14 NOTE — Addendum Note (Signed)
Addended by: Yisroel Ramming, Dietrich Pates E on: 02/14/2015 09:28 AM   Modules accepted: Level of Service

## 2015-02-14 NOTE — Progress Notes (Signed)
Patient ID: Sharon Hicks, female   DOB: 10-29-1961, 54 y.o.   MRN: [id number] GYNECOLOGY  VISIT   HPI: 54 y.o.   Single  Caucasian  female   G0P0000 with Patient's last menstrual period was 10/17/2014.   here to discuss results of urodynamics testing.  Uroflow study - Void 393 cc. PRV 20 cc. Continuous pattern. CMG - S1 243 cc, S2 501 cc, S3 724 cc. Stable CMG. Lots of artifact. LPP 78 cm H2O at 725 cc. LPP 95 at 539 cc. UPP - 33 cm H20. Pressure flow study - Max det pressure 20 cm H20. Voided 653 cc of 724 cc. Valsalva at the end of the void.   Evidence of genuine stress incontinence.  Large bladder volume.   Has 14 week size uterus on examination 12/17/14.  Incontinence for 2 -3 years. Leaks sometimes with cough, laugh, or sneeze. Leaks about 50% of the time.  No leakage with exercise or heavy lifting.  If needs to void, has difficulty maintaining continence.  Kegel's do not stop the incontinence.  DF - q 3.  NF - once per hs.  Enuresis 2 times.   No hematuria or dysuria.  Couple of UTIs in the recent years. No pyelonephritis or stones.   Voiding with straining at the end of stream to complete void.   No fecal incontinence or constipation.   Known uterine fibroids.  Planning on a hysterectomy with Dr. Sabra Heck. Off OCPs now. Incontinence is better now that she is off her OCPs.   Father had urothelial cancer - between the ureter and the kidney.   GYNECOLOGIC HISTORY: Patient's last menstrual period was 10/17/2014. Contraception: abstinence. Menopausal hormone therapy: none        OB History    Gravida Para Term Preterm AB TAB SAB Ectopic Multiple Living   0 0 0 0 0 0 0 0 0 0          Patient Active Problem List   Diagnosis Date Noted  . Arthralgia of shoulder 04/14/2014  . Clinical depression 04/14/2014  . HLD (hyperlipidemia) 04/14/2014  . Adult hypothyroidism 04/14/2014    Past Medical History  Diagnosis Date  . Fibroids   .  Hypothyroidism   . Elevated lipids   . Abnormal Pap smear     h/o ascus-h pap, colpo negative  . Anemia     h/o  . Anxiety     stressors with fahter aging  . Obesity   . Hearing loss in left ear     Past Surgical History  Procedure Laterality Date  . Excision of thyroid nodule  2/00  . Cholecystectomy, laparoscopic  4/08  . Appendectomy  4/08  . Cardiac evaluation  4/07    stress test, echo, EKG-mild MVP  . Eye surgery Right 11/2014    Current Outpatient Prescriptions  Medication Sig Dispense Refill  . Calcium Carbonate (CALCIUM 600 PO) Take by mouth daily.    . Cholecalciferol (D-5000) 5000 UNITS TABS Take by mouth daily.    . Cholecalciferol (VITAMIN D PO) Take by mouth daily.    . Cyanocobalamin (RA VITAMIN B-12 TR) 1000 MCG TBCR Take by mouth daily.    . Eszopiclone 3 MG TABS One tablet immediately before bed as needed for sleep 30 tablet 0  . FLUoxetine (PROZAC) 40 MG capsule Take 40 mg by mouth daily.  2  . levothyroxine (SYNTHROID) 112 MCG tablet Take by mouth daily.    . Omega-3 Fatty Acids (FISH OIL PO) Take by  mouth.    . prednisoLONE acetate (PRED FORTE) 1 % ophthalmic suspension   0   No current facility-administered medications for this visit.     ALLERGIES: Codeine  Family History  Problem Relation Age of Onset  . Alzheimer's disease Mother 66    died  . CVA Mother   . Heart attack Father   . Kidney cancer Father   . Hypertension Father   . Hyperlipidemia Father   . Cancer Father     reoccurred 03/19/13/mets to lungs, adrenal gland, and some lymph nodes    History   Social History  . Marital Status: Single    Spouse Name: N/A  . Number of Children: N/A  . Years of Education: N/A   Occupational History  . Not on file.   Social History Main Topics  . Smoking status: Never Smoker   . Smokeless tobacco: Never Used  . Alcohol Use: 0.6 - 1.2 oz/week    1-2 Standard drinks or equivalent per week     Comment: rarely  . Drug Use: No  . Sexual  Activity: No   Other Topics Concern  . Not on file   Social History Narrative    ROS:  Pertinent items are noted in HPI.  PHYSICAL EXAMINATION:    BP 102/64 mmHg  Pulse 66  Ht 5\' 6"  (1.676 m)  Wt 215 lb (97.523 kg)  BMI 34.72 kg/m2  LMP 10/17/2014     General appearance: alert, cooperative and appears stated age   ASSESSMENT  Stress incontinence improved since stopping OCPs. Genuine stress incontinence on multichannel urodynamic testing. Incontinence at high bladder volumes.  VLPPs are average.  Straining with voiding but voiding adequately.   PLAN  I discussed urodynamic test results in detail.  I discussed midurethral sling - TVT Exact - and cystoscopy - risks and benefits.  I discussed general risks of surgery - bleeding, infection, damage to surrounding organs, reactions to anesthesia, DVT, PE, need for reoperation.  I also discussed in detail risks specifically related to synthetic slings - erosion/exposure/pelvic pain/dyspareunia, cystotomy, urinary retention, UTIs, slower voiding, de nono overactive bladder requiring medical therapy.  I discussed observation and physical therapy as alternatives to a midurethral sling.  Patient is leaning against a midurethral sling at this time.  She would like to discuss surgical care further with Dr. Sabra Heck.   Appointment made.   An After Visit Summary was printed and given to the patient.  __25____ minutes face to face time of which over 50% was spent in counseling.

## 2015-03-02 ENCOUNTER — Ambulatory Visit
Admit: 2015-03-02 | Disposition: A | Discharge status: Home / Self Care | Payer: Self-pay | Attending: Internal Medicine | Admitting: Internal Medicine

## 2015-03-07 ENCOUNTER — Telehealth: Payer: Self-pay | Admitting: Obstetrics & Gynecology

## 2015-03-07 ENCOUNTER — Institutional Professional Consult (permissible substitution): Payer: BLUE CROSS/BLUE SHIELD | Admitting: Obstetrics & Gynecology

## 2015-03-07 NOTE — Telephone Encounter (Signed)
Pt called to cx/rs consult for this afternoon with Dr Sabra Heck. 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 Pt rescheduled for may 20.

## 2015-03-08 NOTE — Telephone Encounter (Signed)
Dr Sabra Heck, just FYI//kn

## 2015-03-23 NOTE — Telephone Encounter (Signed)
Left message for patient to call back. Need to go over benefits and schedule 6 month follow up PUS

## 2015-03-28 NOTE — Telephone Encounter (Signed)
Pt returning Sabrina's call. States she has been out of the country recently.  [phone]

## 2015-03-28 NOTE — Telephone Encounter (Signed)
Call to patient. Advised of benefit quote received for PUS.  Patient agreeable. Will hold off on scheduling until she meets with Dr Sabra Heck on 05.20.2016.

## 2015-04-01 ENCOUNTER — Ambulatory Visit (INDEPENDENT_AMBULATORY_CARE_PROVIDER_SITE_OTHER): Payer: BLUE CROSS/BLUE SHIELD | Admitting: Obstetrics & Gynecology

## 2015-04-01 ENCOUNTER — Encounter: Payer: Self-pay | Admitting: Obstetrics & Gynecology

## 2015-04-01 DIAGNOSIS — N852 Hypertrophy of uterus: Secondary | ICD-10-CM | POA: Diagnosis not present

## 2015-04-01 DIAGNOSIS — D251 Intramural leiomyoma of uterus: Secondary | ICD-10-CM

## 2015-04-01 NOTE — Progress Notes (Signed)
Patient scheduled for Pelvic ultrasound for 04/28/15 with Dr. Sabra Heck. Patient verbalized understanding of the [facility] appointment cancellation policy. Advised will need to cancel or reschedule within 72 business hours of appointment (3 business days) or will have $100.00 late cancellation fee placed to account.

## 2015-04-01 NOTE — Progress Notes (Signed)
  Very nice 54 yo G0 SWF here for discussion regarding possible surgery and known fibroid uterus.  Pt and I have discussed this off and on over the past several years.  She was on OCPs due to bleeding and her uterus continued to slowly increase in size.  In November, 2015, [facility] was 46.9 while she was taking the pills.  Menopause was diagnosed and pt advised to stop OCps.  She was anxious about the possibilty of bleeding but has not had any bleeding since sotpped int eh pills.  As well, some of her urinary symptoms have really decreased.  Pt is not feeling the pressure and urgency like she was.  As well, her incotinence is actually improved.  She did undergo urodynamics with D.r Quincy Simmonds for possible mid-urethral sling, IF we ever proceed with surgery.  Now with decreased symptoms, she is wondering if her fibroid uterus is decreasing in size.  At last discussion, based on uterus size, I recommended a laparotomy.  She was anxious about this route for surgery.  She really doesn't want a cut on her skin if possible.  D/w pt that hysterectomy is usually done for symptom improvement and if she is not having symptoms, she should not proceed with surgery.    I think she will benefit from having actual comparison of uterine size before make decision so will plan for her to return for PUS.    She is having some hot flashes but doesn't want to be on estrogen as she knows this can contribute to increasing fibroid size/growth.  Having some hair loss as well.  Knows this is common, esp with stopping a hormonal method.  OTC options discussed.  She is also aware this is usually temporary and does improve.  She will let me know if she changes her mind.  Assessment:  Uterine fibroids SUI Hair thinning  Plan:  Will plan repeat PUS  Has lab work this morning.  Will get me a copy of this. Pt will check with hairdresser regarding possible other recommendations  ~20 minutes spent with patient >50% of time was in face to face  discussion of above.

## 2015-04-05 ENCOUNTER — Other Ambulatory Visit: Payer: Self-pay

## 2015-04-05 DIAGNOSIS — D251 Intramural leiomyoma of uterus: Secondary | ICD-10-CM

## 2015-04-05 DIAGNOSIS — N852 Hypertrophy of uterus: Secondary | ICD-10-CM

## 2015-04-07 ENCOUNTER — Encounter: Payer: Self-pay | Admitting: Obstetrics & Gynecology

## 2015-04-07 DIAGNOSIS — D251 Intramural leiomyoma of uterus: Secondary | ICD-10-CM | POA: Insufficient documentation

## 2015-04-07 DIAGNOSIS — N852 Hypertrophy of uterus: Secondary | ICD-10-CM | POA: Insufficient documentation

## 2015-04-28 ENCOUNTER — Ambulatory Visit (INDEPENDENT_AMBULATORY_CARE_PROVIDER_SITE_OTHER): Payer: BLUE CROSS/BLUE SHIELD | Admitting: Obstetrics & Gynecology

## 2015-04-28 ENCOUNTER — Ambulatory Visit (INDEPENDENT_AMBULATORY_CARE_PROVIDER_SITE_OTHER): Payer: BLUE CROSS/BLUE SHIELD

## 2015-04-28 VITALS — BP 126/72 | Wt 211.0 lb

## 2015-04-28 DIAGNOSIS — N852 Hypertrophy of uterus: Secondary | ICD-10-CM

## 2015-04-28 DIAGNOSIS — D251 Intramural leiomyoma of uterus: Secondary | ICD-10-CM

## 2015-04-28 NOTE — Progress Notes (Signed)
54 y.o. G0 Single Caucasian female, well known to me, here for a pelvic ultrasound due to history of uterine fibroids.  Pt and I have discussed options for treatment and hysterectomy off and on for several years.  Pt has not been very interested in surgery unless have symptoms related to size of uterus.  These increased over the last few years but pt is now in menopause and off estrogen containing OCPs.  Since that time, pelvic pressure, bladder pressure, and increased urinary urgency has significantly improved.  PUS planned to compare uterus with prior studies.  Pt feels this will help her with her decision.     Patient's last menstrual period was 10/12/2014.  Sexually active:  no  Contraception: abstinence  FINDINGS: UTERUS: 8.0 x 8.9 x 8.7cm with multiple fibroids--2.0cm, 4.2cm, 4.4cm, 1.9cm, and 5.0cm (ovarall decrease in size of uterus and in size of largest fibroids) EMS: 4.56mm, distorted due to fibroids ADNEXA:   Left ovary 2.1 x 1.4 x 1.7cm   Right ovary 2.2 x 2.0 x 1.6cm CUL DE SAC: no free fluid noted  Images reviewed with pt.  Uterus measured 9.7 x 9.3 x 7.3cm with prior ultrasound 12/15 and two largest fibroids were 5.3cm and 6.5cm.  Reviewed images of both ultrasounds with patient.  She continues to question whether she should proceed with surgery.  At this time, as symptoms have improved, I do feel it is ok to wait and watch.  She is advised her uterus and fibroids will continue to decrease in size so I feel she will continue to have some improvement in symptoms.  She really just wants to consider this decision some more.  All questions answered.    Assessment:  Enlarged uterus due to fibroids  Plan: Return for AEX Pt knows if she decides to proceed with surgery, I would most likely need to do a TAH due to size of uterus and no prior pregnancies.  All questions answered.  ~15 minutes spent with patient >50% of time was in face to face discussion of above.

## 2015-05-06 ENCOUNTER — Encounter: Payer: Self-pay | Admitting: Obstetrics & Gynecology

## 2015-06-09 ENCOUNTER — Other Ambulatory Visit: Payer: Self-pay | Admitting: Orthopedic Surgery

## 2015-06-09 DIAGNOSIS — M25512 Pain in left shoulder: Principal | ICD-10-CM

## 2015-06-09 DIAGNOSIS — M7582 Other shoulder lesions, left shoulder: Secondary | ICD-10-CM

## 2015-06-09 DIAGNOSIS — G8929 Other chronic pain: Secondary | ICD-10-CM

## 2015-06-16 ENCOUNTER — Ambulatory Visit
Admission: RE | Admit: 2015-06-16 | Discharge: 2015-06-16 | Disposition: A | Discharge status: Home / Self Care | Payer: BLUE CROSS/BLUE SHIELD | Source: Ambulatory Visit | Attending: Orthopedic Surgery | Admitting: Orthopedic Surgery

## 2015-06-16 DIAGNOSIS — M7552 Bursitis of left shoulder: Secondary | ICD-10-CM | POA: Diagnosis not present

## 2015-06-16 DIAGNOSIS — G8929 Other chronic pain: Secondary | ICD-10-CM

## 2015-06-16 DIAGNOSIS — S46812A Strain of other muscles, fascia and tendons at shoulder and upper arm level, left arm, initial encounter: Secondary | ICD-10-CM | POA: Diagnosis not present

## 2015-06-16 DIAGNOSIS — M7582 Other shoulder lesions, left shoulder: Secondary | ICD-10-CM

## 2015-06-16 DIAGNOSIS — M25512 Pain in left shoulder: Secondary | ICD-10-CM | POA: Diagnosis present

## 2015-06-16 DIAGNOSIS — M75102 Unspecified rotator cuff tear or rupture of left shoulder, not specified as traumatic: Secondary | ICD-10-CM | POA: Diagnosis not present

## 2015-06-16 DIAGNOSIS — M25812 Other specified joint disorders, left shoulder: Secondary | ICD-10-CM | POA: Insufficient documentation

## 2015-07-01 DIAGNOSIS — M75100 Unspecified rotator cuff tear or rupture of unspecified shoulder, not specified as traumatic: Secondary | ICD-10-CM | POA: Insufficient documentation

## 2015-07-12 ENCOUNTER — Encounter: Payer: Self-pay | Admitting: Physical Therapy

## 2015-07-12 ENCOUNTER — Ambulatory Visit: Payer: BLUE CROSS/BLUE SHIELD | Attending: Surgery | Admitting: Physical Therapy

## 2015-07-12 DIAGNOSIS — M25612 Stiffness of left shoulder, not elsewhere classified: Secondary | ICD-10-CM

## 2015-07-12 DIAGNOSIS — M25512 Pain in left shoulder: Secondary | ICD-10-CM | POA: Diagnosis present

## 2015-07-12 DIAGNOSIS — M7582 Other shoulder lesions, left shoulder: Secondary | ICD-10-CM | POA: Diagnosis present

## 2015-07-12 DIAGNOSIS — R29898 Other symptoms and signs involving the musculoskeletal system: Secondary | ICD-10-CM | POA: Insufficient documentation

## 2015-07-12 NOTE — Patient Instructions (Signed)
Shoulder Press: Supine with Protraction   Tubing around back, anchored in extended arms, push fists further toward ceiling from shoulder blades. Repeat _10_ times per set. Do _2_ sets per session. Do _5_ sessions per week.  http://tub.exer.us/292   Copyright  VHI. All rights reserved.  Internal Rotation (Eccentric), (Resistance Band)   Hold band with hand of affected arm. Quickly pull band inward. Keep wrist neutral. Slowly return for 3-5 seconds. Use ____yellow____ resistance band. Hint: Use towel roll between elbow and waist or elbow and hip. _10__ reps per set, _2__ sets per day, _5__ days per week.  Copyright  VHI. All rights reserved.  Strengthening: Resisted External Rotation   Hold tubing in right hand, elbow at side and forearm across body. Rotate forearm out. Repeat __10__ times per set. Do _2___ sets per session. Do __2__ sessions per day.  http://orth.exer.us/829   Copyright  VHI. All rights reserved.

## 2015-07-13 NOTE — Therapy (Signed)
[place] MAIN [facility] SERVICES [place], [place], [address] Phone: [phone]   Fax:  [phone]  Physical Therapy Evaluation  Patient Details  Name: Sharon Hicks MRN: [id number] Date of Birth: Mar 22, 1961 Referring Provider:  Corky Mull, MD  Encounter Date: 07/12/2015      PT End of Session - 07/13/15 0731    Visit Number 1   Number of Visits 17   Date for PT Re-Evaluation 09/06/15   PT Start Time 1640   PT Stop Time 5093   PT Time Calculation (min) 55 min   Activity Tolerance Patient tolerated treatment well   Behavior During Therapy [facility] for tasks assessed/performed      Past Medical History  Diagnosis Date  . Fibroids   . Hypothyroidism   . Elevated lipids   . Abnormal Pap smear     h/o ascus-h pap, colpo negative  . Anemia     h/o  . Anxiety     stressors with fahter aging  . Obesity   . Hearing loss in left ear     Past Surgical History  Procedure Laterality Date  . Excision of thyroid nodule  2/00  . Cholecystectomy, laparoscopic  4/08  . Appendectomy  4/08  . Cardiac evaluation  4/07    stress test, echo, EKG-mild MVP  . Eye surgery Right 11/2014    There were no vitals filed for this visit.  Visit Diagnosis:  Left arm weakness - Plan: PT plan of care cert/re-cert  Decreased ROM of left shoulder - Plan: PT plan of care cert/re-cert  Left shoulder pain - Plan: PT plan of care cert/re-cert      Subjective Assessment - 07/12/15 1645    Subjective 54 yo female presents with left shoulder pain which started 3 years ago. She reports initial onset of left shoulder pain starting after she was having to help care for her elderly father. 6-9 month ago patient noted significant increase in pain with abduction and with IR. Patient received cortisone injection  8 weeks prior with minor relief in pain. Patient reports that she fell down 2 steps last November resulting in head trauma; reports that pain in  the L shoulder increased after the fall.    Pertinent History No surgeries to the shoulder or history Cancer. Patient also has a history of hypothyroidism and anxiety.    Limitations Lifting;House hold activities   How long can you sit comfortably? not a problem    How long can you stand comfortably? not a problem    How long can you walk comfortably? 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    Currently in Pain? No/denies            [facility] PT Assessment - 07/13/15 2671    Assessment   Medical Diagnosis L rotator cuff tear    Onset Date/Surgical Date 07/04/15   Hand Dominance Right   Next MD Visit September 30th    Prior Therapy No therapy for this problem or any others    Precautions   Precautions None   Restrictions   Weight Bearing Restrictions No   Balance Screen   Has the patient fallen in the past 6 months No   Has the patient had  a decrease in activity level because of a fear of falling?  No   Is the patient reluctant to leave their home because of a fear of falling?  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 Sitting at desk and working on computer    Cognition   Overall Cognitive Status Within Functional Limits for tasks assessed   Observation/Other Assessments   Quick DASH  18 (the higher the score the greater the disability)    Sensation   Additional Comments Patient reports that she will have numbness  in the finger when waking up occasionally    Posture/Postural Control   Posture Comments Patient sits with slight forward head and rounded shoulders.    AROM   Overall AROM Comments Shoulder flexion and abduction measured in sitting. Shoulder IR/ER measured at 90 degrees for abduction in supine    Right Shoulder Flexion 170 Degrees   Right Shoulder ABduction 180 Degrees   Right Shoulder Internal Rotation 80 Degrees   Right Shoulder External Rotation 90 Degrees   Left Shoulder Flexion 160 Degrees   Left Shoulder ABduction 155 Degrees   Left Shoulder Internal Rotation 45 Degrees   Left Shoulder External Rotation 20 Degrees   PROM   Overall PROM Comments PROM assessed in supine. End range PROM for the L shoulder increased pain.    Right Shoulder Flexion 180 Degrees   Right Shoulder ABduction 180 Degrees   Right Shoulder Internal Rotation 82 Degrees   Right Shoulder External Rotation 93 Degrees   Left Shoulder Flexion 167 Degrees   Left Shoulder ABduction 162 Degrees   Left Shoulder Internal Rotation 47 Degrees   Left Shoulder External Rotation 23 Degrees   Strength   Overall Strength Comments All R UE MMT 5/5   Left Shoulder Flexion 4+/5   Left Shoulder ABduction 4/5   Left Shoulder Internal Rotation 4+/5   Left Shoulder External Rotation 4+/5   Palpation   Palpation comment Patient notes slight tenderness in sitting to the Greater tubercle of the L shoulder, No sulcus sign noted to palpation. Scapular dyskinesia notes in the L UE with eccentric lowering from Flexion and Abduction.     Spurling's   Findings Negative   Side Left   Comment No numbness/tingling noted with Spurling's Test in sitting.    Neer Impingement test    Findings Negative   Side Left   Hawkins-Kennedy test   Findings Positive   Side Left   Empty Can test   Findings Negative   Side Left   Painful Arc of Motion   Findings Positive   Side Left   Comments Painful from 80 to 110 degrees and at end range     other   Findings Positive   Side Left   Comments Yocum's    Ambulation/Gait   Gait Comments Patient demonstrates reciprocal gait pattern. Noted to have decreased L arm swing.           Treatment:   Patient instructed HEP:  L UE Supine Scapular protraction x10  L UE ER with  yellow Tband x10  L UE IR with yellow tband x10  PT provided patient moderate verbal and tactile instruction for proper exercise set up, to keep motion in pain free range, and to decrease speed and increase control of eccentric movement. Patient demonstrated very good response to instruction.                 PT Education - 07/13/15 0730    Education provided Yes   Education Details Plan of care. Ice for pain management. Positioning while sleeping. HEP - see patient instructions    Person(s) Educated Patient   Methods Explanation;Demonstration;Tactile cues;Verbal cues   Comprehension Verbalized understanding;Returned demonstration;Verbal cues required;Tactile cues required             PT Long Term Goals - 07/13/15 0746    PT LONG TERM GOAL #1   Title Patient will be independent with HEP to improve shoulder ROM and Strength to allow improved function with home activities by 09/06/15.   Time 8   Period Weeks   Status New   PT LONG TERM GOAL #2   Title Patient will improve score on the Quick DASH to <10 to indicate improved function with ADLs and return to PLOF by 09/06/15.   Time 8   Period Weeks   Status New   PT LONG TERM GOAL #3   Title Patient will improve Shoulder IR to at least 70 Degrees to allow patient to don/doff undergarments without difficulty by 09/06/15.   Time 8   Period Weeks   Status New   PT LONG TERM GOAL #4   Title Patient will increase shoulder ER to at least 60 degrees to allow her to wash and brush hair with greater ease by 09/06/15.   PT LONG TERM GOAL #5   Title Patient will improve shoulder strength to 5/5 for all motions to allow her to lift heavy objects for  increased independence with all home tasks by 09/06/15.   Time 8   Period Weeks   Status New   Additional Long Term Goals   Additional Long Term Goals Yes   PT LONG TERM GOAL #6   Title Patient will report a worst pain of 3/10 on VAS in     left shoulder  to improve tolerance with ADLs and reduced symptoms with activities by 09/06/15   Time 8   Period Weeks   Status New               Plan - 07/13/15 0732    Clinical Impression Statement This patient is pleasant 54 year old female that reports to PT with complaints of shoulder pain and decreased Range of motion. Recent MRI results show small tear the supraspinatus muscle as well as tendonitis. Upon PT evaluation this patient was found to have decreased strength in the L UE for all motion assessed compared to the R UE. ROM was also limited in the L UE, especially in shoulder IR/ER. Positive shoulder impingement tests include Yocums, painful arc sign, and Hawkins-Kennedy. Patient was also noted to have scapular dykinesia on the L UE with eccentric lowering from flexion and abduction. Based on PT evaluation, this patient would benefit from skilled PT to address shoulder impingement/RTC tear to improve L LE ROM, increase L UE strength, and address scapular dyskinesia to allow return to PLOF.    Pt will benefit from skilled therapeutic intervention in order to improve on the following deficits Decreased knowledge of precautions;Decreased range of motion;Decreased safety awareness;Decreased strength;Hypomobility;Impaired  flexibility;Impaired UE functional use;Pain;Postural dysfunction;Improper body mechanics   Rehab Potential Good   Clinical Impairments Affecting Rehab Potential Negative: shoudler pain >6 months. Postive: motivated to get better, high level of function prior to injury.    PT Frequency 2x / week   PT Duration 8 weeks   PT Treatment/Interventions ADLs/Self Care Home Management;Electrical Stimulation;Cryotherapy;Iontophoresis  4mg /ml Dexamethasone;Moist Heat;Ultrasound;Therapeutic activities;Therapeutic exercise;Patient/family education;Manual techniques;Passive range of motion;Dry needling;Energy conservation   PT Next Visit Plan Shoulder strengthening/ROM, Manual therapy    PT Home Exercise Plan See patient instructions    Consulted and Agree with Plan of Care Patient         Problem List Patient Active Problem List   Diagnosis Date Noted  . Enlarged uterus 04/07/2015  . Intramural leiomyoma of uterus 04/07/2015  . 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 Arthralgia of shoulder 04/14/2014  . Clinical depression 04/14/2014  . HLD (hyperlipidemia) 04/14/2014  . Adult hypothyroidism 04/14/2014   Barrie Folk SPT 07/13/2015   2:08 PM  This entire session was performed under direct supervision and direction of a licensed therapist. I have personally read, edited and approve of the note as written.  Hopkins,Margaret, PT, DPT 07/13/2015, 2:08 PM  [place] MAIN [facility] SERVICES [address] [place], [place], [address] Phone: [phone]   Fax:  [phone]

## 2015-07-21 ENCOUNTER — Ambulatory Visit: Payer: BLUE CROSS/BLUE SHIELD | Attending: Surgery

## 2015-07-21 DIAGNOSIS — R29898 Other symptoms and signs involving the musculoskeletal system: Secondary | ICD-10-CM | POA: Insufficient documentation

## 2015-07-21 DIAGNOSIS — M7582 Other shoulder lesions, left shoulder: Secondary | ICD-10-CM | POA: Insufficient documentation

## 2015-07-21 DIAGNOSIS — M25512 Pain in left shoulder: Secondary | ICD-10-CM | POA: Diagnosis present

## 2015-07-21 DIAGNOSIS — M25612 Stiffness of left shoulder, not elsewhere classified: Secondary | ICD-10-CM

## 2015-07-21 NOTE — Patient Instructions (Signed)
[email or web address] Shoulder external rotation with wand in sitting 2x10 sidelying shoulder external rotation 2x10 Scapular retractions 2x10 Shoulder extension with yellow band 2x10

## 2015-07-21 NOTE — Therapy (Signed)
[place] MAIN [facility] SERVICES [address] [place], [place], [address] Phone: [phone]   Fax:  [phone]  Physical Therapy Treatment  Patient Details  Name: Sharon Hicks MRN: [id number] Date of Birth: December 09, 1960 Referring Provider:  Corky Mull, MD  Encounter Date: 07/21/2015      PT End of Session - 07/21/15 1852    Visit Number 2   Number of Visits 17   Date for PT Re-Evaluation 09/06/15   PT Start Time 2633   PT Stop Time 1815   PT Time Calculation (min) 40 min   Activity Tolerance Patient tolerated treatment well   Behavior During Therapy [facility] for tasks assessed/performed      Past Medical History  Diagnosis Date  . Fibroids   . Hypothyroidism   . Elevated lipids   . Abnormal Pap smear     h/o ascus-h pap, colpo negative  . Anemia     h/o  . Anxiety     stressors with fahter aging  . Obesity   . Hearing loss in left ear     Past Surgical History  Procedure Laterality Date  . Excision of thyroid nodule  2/00  . Cholecystectomy, laparoscopic  4/08  . Appendectomy  4/08  . Cardiac evaluation  4/07    stress test, echo, EKG-mild MVP  . Eye surgery Right 11/2014    There were no vitals filed for this visit.  Visit Diagnosis:  Left arm weakness  Decreased ROM of left shoulder  Left shoulder pain      Subjective Assessment - 07/21/15 1851    Subjective Pt reports she is compliant with her HEP and denies any pain currently.     Pertinent History No surgeries to the shoulder or history Cancer. Patient also has a history of hypothyroidism and anxiety.    Limitations Lifting;House hold activities   How long can you sit comfortably? not a problem    How long can you stand comfortably? not a problem    How long can you walk comfortably? as long as needed   Diagnostic tests MRI shows tear in the L Supraspinatus muscles    Patient Stated Goals be able to snap bra, be able to sleep on the L side and lift objects  into abduction with the L UE .    Currently in Pain? No/denies       Manual therapy: Left shoulder PROM (abduction, internal and external rotation) 2x10 with small oscillations at end range Shoulder in open packed position and A/P mobs grade 3 , 2x30 sec  There ex: Serratus anterior punches x10 and with 2# 2x10  sidelying external rotation with towel roll  x10 and with 1#x10 Sitting shoulder AAROM external rotation with wand 2x10 Standing shoulder external and internal rotation with yellow band x10 each Scapular retractions 2x10 Shoulder extension with yellow band 2x10 pt required min verbal and tactile cues for correct hand placement and exercise technique                         PT Education - 07/21/15 1852    Education provided Yes   Education Details plan of care and new HEP exercises    Person(s) Educated Patient   Methods Explanation;Demonstration   Comprehension Verbalized understanding;Returned demonstration             PT Long Term Goals - 07/13/15 0746    PT LONG TERM GOAL #1  Title Patient will be independent with HEP to improve shoulder ROM and Strength to allow improved function with home activities by 09/06/15.   Time 8   Period Weeks   Status New   PT LONG TERM GOAL #2   Title Patient will improve score on the Quick DASH to <10 to indicate improved function with ADLs and return to PLOF by 09/06/15.   Time 8   Period Weeks   Status New   PT LONG TERM GOAL #3   Title Patient will improve Shoulder IR to at least 70 Degrees to allow patient to don/doff undergarments without difficulty by 09/06/15.   Time 8   Period Weeks   Status New   PT LONG TERM GOAL #4   Title Patient will increase shoulder ER to at least 60 degrees to allow her to wash and brush hair with greater ease by 09/06/15.   PT LONG TERM GOAL #5   Title Patient will improve shoulder strength to 5/5 for all motions to allow her to lift heavy objects for increased  independence with all home tasks by 09/06/15.   Time 8   Period Weeks   Status New   Additional Long Term Goals   Additional Long Term Goals Yes   PT LONG TERM GOAL #6   Title Patient will report a worst pain of 3/10 on VAS in     left shoulder  to improve tolerance with ADLs and reduced symptoms with activities by 09/06/15   Time 8   Period Weeks   Status New               Plan - 07/21/15 1856    Clinical Impression Statement Pt tolerated session well today with no increase in pain.  Session consisted of PROM and AAROM and there ex to strengthen shoulder and scapular musculature.     Pt will benefit from skilled therapeutic intervention in order to improve on the following deficits Decreased knowledge of precautions;Decreased range of motion;Decreased safety awareness;Decreased strength;Hypomobility;Impaired flexibility;Impaired UE functional use;Pain;Postural dysfunction;Improper body mechanics   Rehab Potential Good   Clinical Impairments Affecting Rehab Potential Negative: shoudler pain >6 months. Postive: motivated to get better, high level of function prior to injury.    PT Frequency 2x / week   PT Duration 8 weeks   PT Treatment/Interventions ADLs/Self Care Home Management;Electrical Stimulation;Cryotherapy;Iontophoresis 4mg /ml Dexamethasone;Moist Heat;Ultrasound;Therapeutic activities;Therapeutic exercise;Patient/family education;Manual techniques;Passive range of motion;Dry needling;Energy conservation   PT Next Visit Plan Shoulder strengthening/ROM, Manual therapy    PT Home Exercise Plan See patient instructions    Consulted and Agree with Plan of Care Patient        Problem List Patient Active Problem List   Diagnosis Date Noted  . Enlarged uterus 04/07/2015  . Intramural leiomyoma of uterus 04/07/2015  . 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 Arthralgia of shoulder 04/14/2014  . Clinical depression 04/14/2014  . HLD (hyperlipidemia) 04/14/2014  . Adult  hypothyroidism 04/14/2014   Renford Dills, SPT  This entire session was performed under direct supervision and direction of a licensed therapist/therapist assistant . I have personally read, edited and approve of the note as written. Gorden Harms. Tortorici, PT, DPT [phone]  Tortorici,Ashley 07/21/2015, 7:04 PM  [place] MAIN [facility] SERVICES [address] [place], [place], [address] Phone: [phone]   Fax:  [phone]

## 2015-07-25 ENCOUNTER — Ambulatory Visit: Payer: BLUE CROSS/BLUE SHIELD

## 2015-07-25 DIAGNOSIS — R29898 Other symptoms and signs involving the musculoskeletal system: Secondary | ICD-10-CM

## 2015-07-25 DIAGNOSIS — M25612 Stiffness of left shoulder, not elsewhere classified: Secondary | ICD-10-CM

## 2015-07-25 DIAGNOSIS — M25512 Pain in left shoulder: Secondary | ICD-10-CM

## 2015-07-26 NOTE — Patient Instructions (Signed)
[email or web address] Horizontal adduction stretch 3x30 sec pec wall stretch 3x30 sec Cross friction massage x 2 min

## 2015-07-26 NOTE — Therapy (Signed)
[place] MAIN [facility] SERVICES [address] [place], [place], [address] Phone: [phone]   Fax:  [phone]  Physical Therapy Treatment  Patient Details  Name: Sharon Hicks MRN: [id number] Date of Birth: 1961-04-18 Referring Provider:  Corky Mull, MD  Encounter Date: 07/25/2015      PT End of Session - 07/26/15 0829    Visit Number 3   Number of Visits 17   Date for PT Re-Evaluation 09/06/15   PT Start Time 6060   PT Stop Time 1818   PT Time Calculation (min) 43 min   Activity Tolerance Patient tolerated treatment well   Behavior During Therapy [facility] for tasks assessed/performed      Past Medical History  Diagnosis Date  . Fibroids   . Hypothyroidism   . Elevated lipids   . Abnormal Pap smear     h/o ascus-h pap, colpo negative  . Anemia     h/o  . Anxiety     stressors with fahter aging  . Obesity   . Hearing loss in left ear     Past Surgical History  Procedure Laterality Date  . Excision of thyroid nodule  2/00  . Cholecystectomy, laparoscopic  4/08  . Appendectomy  4/08  . Cardiac evaluation  4/07    stress test, echo, EKG-mild MVP  . Eye surgery Right 11/2014    There were no vitals filed for this visit.  Visit Diagnosis:  Left arm weakness  Decreased ROM of left shoulder  Left shoulder pain      Subjective Assessment - 07/26/15 0827    Subjective Pt reports she did a lot of yard work on Saturday which mostly consisted of raking and sweeping and has had increased pain today with current pain of 2-3/10 in her left anterior shoulder and upper trap.  Pt has been compliant with HEP and states scapular retractions have been increasing her shoulder pain and may need to review for correct technique.      Pertinent History No surgeries to the shoulder or history Cancer. Patient also has a history of hypothyroidism and anxiety.    Limitations Lifting;House hold activities   How long can you sit comfortably? not  a problem    How long can you stand comfortably? not a problem    How long can you walk comfortably? 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    Currently in Pain? Yes   Pain Score 3    Pain Location Shoulder   Pain Orientation Left;Anterior   Pain Descriptors / Indicators Aching          Manual therapy: Left shoulder external PROM 2x10 with small oscillations at end range Left shoulder external rotation MET in supine with arm abducted 30 degrees Passive horizontal adduction stretch 3x30 sec Cross friction massage to anterior shoulder over supraspinatus tendon  Shoulder in open packed position and A/P mobs grade 3 , 1x30 sec Pt's active and passive external ROM improved and noted decreased pain  AROM: 30 PROM: 38  There ex: Scapular retractions 2x10 Horizontal adduction stretch 2x30 sec Wall stretch (pec stretch) 2x30 sec pt required min verbal and tactile cues for correct hand placement and exercise technique  PT Long Term Goals - 07/13/15 0746    PT LONG TERM GOAL #1   Title Patient will be independent with HEP to improve shoulder ROM and Strength to allow improved function with home activities by 09/06/15.   Time 8   Period Weeks   Status New   PT LONG TERM GOAL #2   Title Patient will improve score on the Quick DASH to <10 to indicate improved function with ADLs and return to PLOF by 09/06/15.   Time 8   Period Weeks   Status New   PT LONG TERM GOAL #3   Title Patient will improve Shoulder IR to at least 70 Degrees to allow patient to don/doff undergarments without difficulty by 09/06/15.   Time 8   Period Weeks   Status New   PT LONG TERM GOAL #4   Title Patient will increase shoulder ER to at least 60 degrees to allow her to wash and brush hair  with greater ease by 09/06/15.   PT LONG TERM GOAL #5   Title Patient will improve shoulder strength to 5/5 for all motions to allow her to lift heavy objects for increased independence with all home tasks by 09/06/15.   Time 8   Period Weeks   Status New   Additional Long Term Goals   Additional Long Term Goals Yes   PT LONG TERM GOAL #6   Title Patient will report a worst pain of 3/10 on VAS in     left shoulder  to improve tolerance with ADLs and reduced symptoms with activities by 09/06/15   Time 8   Period Weeks   Status New               Plan - 07/26/15 0837    Clinical Impression Statement pt responded well to manual therapy today in decreasing her left shoulder pain and improving left shoulder external rotation.  Pt's limited external rotation appears to be soft tissue related due to responding well to manual therapy and normal capsular glenohumeral joint feel.       Pt will benefit from skilled therapeutic intervention in order to improve on the following deficits Decreased knowledge of precautions;Decreased range of motion;Decreased safety awareness;Decreased strength;Hypomobility;Impaired flexibility;Impaired UE functional use;Pain;Postural dysfunction;Improper body mechanics   Rehab Potential Good   Clinical Impairments Affecting Rehab Potential Negative: shoudler pain >6 months. Postive: motivated to get better, high level of function prior to injury.    PT Frequency 2x / week   PT Duration 8 weeks   PT Treatment/Interventions ADLs/Self Care Home Management;Electrical Stimulation;Cryotherapy;Iontophoresis 54m/ml Dexamethasone;Moist Heat;Ultrasound;Therapeutic activities;Therapeutic exercise;Patient/family education;Manual techniques;Passive range of motion;Dry needling;Energy conservation   PT Next Visit Plan Shoulder strengthening/ROM, Manual therapy    Consulted and Agree with Plan of Care Patient        Problem List Patient Active Problem List   Diagnosis Date  Noted  . Enlarged uterus 04/07/2015  . Intramural leiomyoma of uterus 04/07/2015  . 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 Arthralgia of shoulder 04/14/2014  . Clinical depression 04/14/2014  . HLD (hyperlipidemia) 04/14/2014  . Adult hypothyroidism 04/14/2014   JRenford Dills SPT This entire session was performed under direct supervision and direction of a licensed therapist/therapist assistant . I have personally read, edited and approve of the note as written. AGorden Harms Tortorici, PT, DPT #[id number] Tortorici,Ashley 07/26/2015, 10:43 AM  [place] [address] [address]Phone: [phone]  Fax:  [phone]

## 2015-07-27 ENCOUNTER — Ambulatory Visit: Payer: BLUE CROSS/BLUE SHIELD

## 2015-07-27 DIAGNOSIS — R29898 Other symptoms and signs involving the musculoskeletal system: Secondary | ICD-10-CM | POA: Diagnosis not present

## 2015-07-27 DIAGNOSIS — M25612 Stiffness of left shoulder, not elsewhere classified: Secondary | ICD-10-CM

## 2015-07-27 DIAGNOSIS — M25512 Pain in left shoulder: Secondary | ICD-10-CM

## 2015-07-28 NOTE — Patient Instructions (Signed)
[email or web address] Standing biceps stretch 20s x 3

## 2015-07-28 NOTE — Therapy (Signed)
[place] MAIN [facility] SERVICES [address] [place], [place], [address] Phone: [phone]   Fax:  [phone]  Physical Therapy Treatment  Patient Details  Name: Sharon Hicks MRN: [id number] Date of Birth: 1961-01-01 Referring Provider:  Christena Flake, MD  Encounter Date: 07/27/2015      PT End of Session - 07/28/15 0805    Visit Number 4   Number of Visits 17   Date for PT Re-Evaluation 09/06/15   PT Start Time 1734   PT Stop Time 1820   PT Time Calculation (min) 46 min   Activity Tolerance Patient tolerated treatment well   Behavior During Therapy [facility] for tasks assessed/performed      Past Medical History  Diagnosis Date  . Fibroids   . Hypothyroidism   . Elevated lipids   . Abnormal Pap smear     h/o ascus-h pap, colpo negative  . Anemia     h/o  . Anxiety     stressors with fahter aging  . Obesity   . Hearing loss in left ear     Past Surgical History  Procedure Laterality Date  . Excision of thyroid nodule  2/00  . Cholecystectomy, laparoscopic  4/08  . Appendectomy  4/08  . Cardiac evaluation  4/07    stress test, echo, EKG-mild MVP  . Eye surgery Right 11/2014    There were no vitals filed for this visit.  Visit Diagnosis:  Left arm weakness  Left shoulder pain  Decreased ROM of left shoulder      Subjective Assessment - 07/28/15 0803    Subjective pt reports she is not as sore as monday. pt reports her pain is located around the [facility] joint and anterior shoulder with end range flexion of the shoulder. (pt points)   Pertinent History No surgeries to the shoulder or history Cancer. Patient also has a history of hypothyroidism and anxiety.    Limitations Lifting;House hold activities   How long can you sit comfortably? not a problem    How long can you stand comfortably? not a problem    How long can you walk comfortably? 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    Currently in Pain? Yes   Pain Score 2    Pain Location --  L anterior shoulder     Manual therapy: Soft tissue massage to L deltoid and biceps including efflurage and muscle stripping Patient received dry needling therapy education and acknowledged understanding of risks and benefits of dry needling therapy prior to receiving treatment. Patient voiced understanding of treatment options and elected to proceed with dry needling therapy.   Trigger point dry needling to L deltoid- twitch response noted Improved painfree flexion noted following dry needling PA and inferior GH glides grade 3 2x10 Mobilization with movement into ER with x 10 Biceps stretch   Therex: Ergonomic consult with pt demo Posture ed with lumbar roll Biceps stretch 30s x 2                             PT Education - 07/28/15 0804    Education provided Yes   Education Details posutre ed. desk Arts development officer) Educated Patient  Methods Explanation;Demonstration   Comprehension Verbalized understanding;Returned demonstration             PT Long Term Goals - 07/13/15 0746    PT LONG TERM GOAL #1   Title Patient will be independent with HEP to improve shoulder ROM and Strength to allow improved function with home activities by 09/06/15.   Time 8   Period Weeks   Status New   PT LONG TERM GOAL #2   Title Patient will improve score on the Quick DASH to <10 to indicate improved function with ADLs and return to PLOF by 09/06/15.   Time 8   Period Weeks   Status New   PT LONG TERM GOAL #3   Title Patient will improve Shoulder IR to at least 70 Degrees to allow patient to don/doff undergarments without difficulty by 09/06/15.   Time 8   Period Weeks   Status New   PT LONG TERM GOAL #4   Title Patient will increase shoulder ER to at least 60 degrees to  allow her to wash and brush hair with greater ease by 09/06/15.   PT LONG TERM GOAL #5   Title Patient will improve shoulder strength to 5/5 for all motions to allow her to lift heavy objects for increased independence with all home tasks by 09/06/15.   Time 8   Period Weeks   Status New   Additional Long Term Goals   Additional Long Term Goals Yes   PT LONG TERM GOAL #6   Title Patient will report a worst pain of 3/10 on VAS in     left shoulder  to improve tolerance with ADLs and reduced symptoms with activities by 09/06/15   Time 8   Period Weeks   Status New               Plan - 07/28/15 1610    Clinical Impression Statement pt responded well to manual therapy today which did include trigger point dry needling to the deltoid. likely trigger points in the deltoid and biceps region due to muscle over use due to scapular weakness. pt had much reduced pain and improved fleixon ROM at end of session. discussed  ergonomics and posture as well to limit impingement.    Pt will benefit from skilled therapeutic intervention in order to improve on the following deficits Decreased knowledge of precautions;Decreased range of motion;Decreased safety awareness;Decreased strength;Hypomobility;Impaired flexibility;Impaired UE functional use;Pain;Postural dysfunction;Improper body mechanics   Rehab Potential Poor   Clinical Impairments Affecting Rehab Potential Negative: shoudler pain >6 months. Postive: motivated to get better, high level of function prior to injury.    PT Frequency 2x / week   PT Duration 8 weeks   PT Treatment/Interventions ADLs/Self Care Home Management;Electrical Stimulation;Cryotherapy;Iontophoresis 4mg /ml Dexamethasone;Moist Heat;Ultrasound;Therapeutic activities;Therapeutic exercise;Patient/family education;Manual techniques;Passive range of motion;Dry needling;Energy conservation   PT Next Visit Plan Shoulder strengthening/ROM, Manual therapy    Consulted and Agree with  Plan of Care Patient        Problem List Patient Active Problem List   Diagnosis Date Noted  . Enlarged uterus 04/07/2015  . Intramural leiomyoma of uterus 04/07/2015  . 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 Arthralgia of shoulder 04/14/2014  . Clinical depression 04/14/2014  . HLD (hyperlipidemia) 04/14/2014  . Adult hypothyroidism 04/14/2014   Carlyon Shadow. Prajwal Fellner, PT, DPT [phone]  Trinisha Paget 07/28/2015, 8:46 AM  [facility] MAIN [facility] SERVICES [address] [place], [place], [address] Phone: [phone]   Fax:  [phone]

## 2015-08-01 ENCOUNTER — Ambulatory Visit: Payer: BLUE CROSS/BLUE SHIELD

## 2015-08-01 DIAGNOSIS — R29898 Other symptoms and signs involving the musculoskeletal system: Secondary | ICD-10-CM | POA: Diagnosis not present

## 2015-08-01 DIAGNOSIS — M25512 Pain in left shoulder: Secondary | ICD-10-CM

## 2015-08-01 DIAGNOSIS — M25612 Stiffness of left shoulder, not elsewhere classified: Secondary | ICD-10-CM

## 2015-08-02 NOTE — Therapy (Addendum)
[place] MAIN [facility] SERVICES [address] [place], [place], [address] Phone: [phone]   Fax:  [phone]  Physical Therapy Treatment  Patient Details  Name: Sharon Hicks MRN: [id number] Date of Birth: [birth year] Referring Provider:  Corky Mull, MD  Encounter Date: 08/01/2015      PT End of Session - 08/02/15 1224    Visit Number 5   Number of Visits 17   Date for PT Re-Evaluation 09/06/15   PT Start Time 6967   PT Stop Time 1815   PT Time Calculation (min) 42 min   Activity Tolerance Patient tolerated treatment well   Behavior During Therapy [facility] for tasks assessed/performed      Past Medical History  Diagnosis Date  . Fibroids   . Hypothyroidism   . Elevated lipids   . Abnormal Pap smear     h/o ascus-h pap, colpo negative  . Anemia     h/o  . Anxiety     stressors with fahter aging  . Obesity   . Hearing loss in left ear     Past Surgical History  Procedure Laterality Date  . Excision of thyroid nodule  2/00  . Cholecystectomy, laparoscopic  4/08  . Appendectomy  4/08  . Cardiac evaluation  4/07    stress test, echo, EKG-mild MVP  . Eye surgery Right 11/2014    There were no vitals filed for this visit.  Visit Diagnosis:  Left arm weakness  Left shoulder pain  Decreased ROM of left shoulder      Subjective Assessment - 08/02/15 1223    Subjective Pt reports she felt well after her last session and only had minor soreness afterwards.  Pt thinks dry needling helped "loosen things up".  Pt's shoulder extension has improved and still feels pain in her biceps with shoulder extension.     Pertinent History No surgeries to the shoulder or history Cancer. Patient also has a history of hypothyroidism and anxiety.    Limitations Lifting;House hold activities   How long can you sit comfortably? not a problem    How long can you stand comfortably? not a problem    How long can you walk comfortably? 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    Currently in Pain? No/denies   Pain Score 0-No pain        Manual therapy: Soft tissue massage to L deltoid and biceps including efflurage and muscle stripping Patient received dry needling therapy education and acknowledged understanding of risks and benefits of dry needling therapy prior to receiving treatment. Patient voiced understanding of treatment options and elected to proceed with dry needling therapy.  Supervising PT performed Trigger point dry needling to L deltoid- and L biceps . Twitch response and deep ache noted Improved painfree flexion and IR noted following dry needling PA and inferior GH glides grade 3 2x20 sec Mobilization with movement into ER with 3x 10  Therex: Biceps stretch 30s x 2 Shoulder internal rotation with wand 2x10 Pt required verbal and visual cueing for correct technique  Pt's shoulder extension/internal rotation after treatment  PT Education - 08/02/15 1224    Education provided Yes   Education Details plan of care and new exercise for HEP   Person(s) Educated Patient   Methods Explanation;Demonstration   Comprehension Verbalized understanding;Returned demonstration             PT Long Term Goals - 07/13/15 0746    PT LONG TERM GOAL #1   Title Patient will be independent with HEP to improve shoulder ROM and Strength to allow improved function with home activities by 09/06/15.   Time 8   Period Weeks   Status New   PT LONG TERM GOAL #2   Title Patient will improve score on the Quick DASH to <10 to indicate improved function with ADLs and return to PLOF by 09/06/15.   Time 8   Period Weeks   Status New   PT LONG TERM GOAL #3   Title Patient will improve Shoulder IR to at least 70 Degrees to  allow patient to don/doff undergarments without difficulty by 09/06/15.   Time 8   Period Weeks   Status New   PT LONG TERM GOAL #4   Title Patient will increase shoulder ER to at least 60 degrees to allow her to wash and brush hair with greater ease by 09/06/15.   PT LONG TERM GOAL #5   Title Patient will improve shoulder strength to 5/5 for all motions to allow her to lift heavy objects for increased independence with all home tasks by 09/06/15.   Time 8   Period Weeks   Status New   Additional Long Term Goals   Additional Long Term Goals Yes   PT LONG TERM GOAL #6   Title Patient will report a worst pain of 3/10 on VAS in     left shoulder  to improve tolerance with ADLs and reduced symptoms with activities by 09/06/15   Time 8   Period Weeks   Status New               Plan - 08/02/15 1228    Clinical Impression Statement Pt is responding well to manual therapy with improved shoulder extension/internal ROM and decreased pain at the end of the session.   pt instructed on new shoulder internal exercise to increase her ROM in order to perform functional tasks such as unclipping her bra strap.     Pt will benefit from skilled therapeutic intervention in order to improve on the following deficits Decreased knowledge of precautions;Decreased range of motion;Decreased safety awareness;Decreased strength;Hypomobility;Impaired flexibility;Impaired UE functional use;Pain;Postural dysfunction;Improper body mechanics   Rehab Potential Poor   Clinical Impairments Affecting Rehab Potential Negative: shoudler pain >6 months. Postive: motivated to get better, high level of function prior to injury.    PT Frequency 2x / week   PT Duration 8 weeks   PT Treatment/Interventions ADLs/Self Care Home Management;Electrical Stimulation;Cryotherapy;Iontophoresis 4mg /ml Dexamethasone;Moist Heat;Ultrasound;Therapeutic activities;Therapeutic exercise;Patient/family education;Manual techniques;Passive range  of motion;Dry needling;Energy conservation   PT Next Visit Plan Shoulder strengthening/ROM, Manual therapy    Consulted and Agree with Plan of Care Patient        Problem List Patient Active Problem List   Diagnosis Date Noted  . Enlarged uterus 04/07/2015  . Intramural leiomyoma of uterus 04/07/2015  . 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 Arthralgia of shoulder 04/14/2014  . Clinical depression 04/14/2014  . HLD (hyperlipidemia) 04/14/2014  . Adult hypothyroidism 04/14/2014   Renford Dills, SPT This entire session was performed under direct supervision and direction of a  licensed Chiropractor . I have personally read, edited and approve of the note as written. Gorden Harms. Tortorici, PT, DPT [phone]  Tortorici,Ashley 08/02/2015, 1:22 PM  [place] [facility] MAIN [facility] SERVICES [address] [place], [place], [address] Phone: [phone]   Fax:  [phone]

## 2015-08-02 NOTE — Patient Instructions (Signed)
[email or web address] Shoulder internal rotation with wand 2x10

## 2015-08-03 ENCOUNTER — Ambulatory Visit: Payer: BLUE CROSS/BLUE SHIELD

## 2015-08-03 DIAGNOSIS — R29898 Other symptoms and signs involving the musculoskeletal system: Secondary | ICD-10-CM | POA: Diagnosis not present

## 2015-08-03 DIAGNOSIS — M25512 Pain in left shoulder: Secondary | ICD-10-CM

## 2015-08-04 NOTE — Therapy (Signed)
[place] MAIN [facility] SERVICES [address] [place], [place], [address] Phone: [phone]   Fax:  [phone]  Physical Therapy Treatment  Patient Details  Name: Sharon Hicks MRN: [id number] Date of Birth: 07-10-1961 Referring Provider:  Corky Mull, MD  Encounter Date: 08/03/2015      PT End of Session - 08/04/15 1628    Visit Number 6   Number of Visits 17   Date for PT Re-Evaluation 09/06/15   PT Start Time 6644   PT Stop Time 1820   PT Time Calculation (min) 47 min   Activity Tolerance Patient tolerated treatment well   Behavior During Therapy [facility] for tasks assessed/performed      Past Medical History  Diagnosis Date  . Fibroids   . Hypothyroidism   . Elevated lipids   . Abnormal Pap smear     h/o ascus-h pap, colpo negative  . Anemia     h/o  . Anxiety     stressors with fahter aging  . Obesity   . Hearing loss in left ear     Past Surgical History  Procedure Laterality Date  . Excision of thyroid nodule  2/00  . Cholecystectomy, laparoscopic  4/08  . Appendectomy  4/08  . Cardiac evaluation  4/07    stress test, echo, EKG-mild MVP  . Eye surgery Right 11/2014    There were no vitals filed for this visit.  Visit Diagnosis:  Left arm weakness  Left shoulder pain      Subjective Assessment - 08/04/15 1626    Subjective pt reports she feels like her arm has less restricted ROM following last session.    Pertinent History No surgeries to the shoulder or history Cancer. Patient also has a history of hypothyroidism and anxiety.    Limitations Lifting;House hold activities   How long can you sit comfortably? not a problem    How long can you stand comfortably? not a problem    How long can you walk comfortably? as long as needed   Diagnostic tests MRI shows tear in the L Supraspinatus muscles    Patient Stated Goals be able to snap bra, be able to sleep on the L side and lift objects into abduction with the L  UE .    Pain Score 1    Pain Location --  L shoulder in area of biceps tendon      Manual therapy: Soft tissue massage to L deltoid and biceps including efflurage and muscle stripping Patient received dry needling therapy education and acknowledged understanding of risks and benefits of dry needling therapy prior to receiving treatment. Patient voiced understanding of treatment options and elected to proceed with dry needling therapy.  Supervising PT performed Trigger point dry needling to L deltoid- and L biceps . Twitch response and deep ache noted Improved painfree flexion and IR noted following dry needling Mobilization with movement into flexion (AAROM shoulder flexion) mob belt provided posterior/inferior glide 3x10 Therex: UBE x 2 min warm up no charge wall slide with lift off 2x10  Shoulder ER with 2lbs 2x10 Shoulder abd in SL 2lbs 2x10 Shoulder flexion in SL 2lbs 2x10 (to 90deg) D1 shoudler extension yellow band 2x10 D2 shoulder flexion 0bs AROM 2x10 Push up with plus on wall 2x5                         PT Education - 08/04/15 1628  Education provided Yes   Education Details dry needling education    Person(s) Educated Patient   Methods Explanation   Comprehension Verbalized understanding             PT Long Term Goals - 07/13/15 0746    PT LONG TERM GOAL #1   Title Patient will be independent with HEP to improve shoulder ROM and Strength to allow improved function with home activities by 09/06/15.   Time 8   Period Weeks   Status New   PT LONG TERM GOAL #2   Title Patient will improve score on the Quick DASH to <10 to indicate improved function with ADLs and return to PLOF by 09/06/15.   Time 8   Period Weeks   Status New   PT LONG TERM GOAL #3   Title Patient will improve Shoulder IR to at least 70 Degrees to allow patient to don/doff undergarments without difficulty by 09/06/15.   Time 8   Period Weeks   Status New   PT LONG  TERM GOAL #4   Title Patient will increase shoulder ER to at least 60 degrees to allow her to wash and brush hair with greater ease by 09/06/15.   PT LONG TERM GOAL #5   Title Patient will improve shoulder strength to 5/5 for all motions to allow her to lift heavy objects for increased independence with all home tasks by 09/06/15.   Time 8   Period Weeks   Status New   Additional Long Term Goals   Additional Long Term Goals Yes   PT LONG TERM GOAL #6   Title Patient will report a worst pain of 3/10 on VAS in     left shoulder  to improve tolerance with ADLs and reduced symptoms with activities by 09/06/15   Time 8   Period Weeks   Status New               Plan - 08/04/15 1628    Clinical Impression Statement pt responding well to dry needling and manual therapy reported reduced pain and improved ROM. pt shows improved IR to be able to reach her back pocket. pt did well with progression of therex for scapular and shoulder strengthening without having increased pain.    Pt will benefit from skilled therapeutic intervention in order to improve on the following deficits Decreased knowledge of precautions;Decreased range of motion;Decreased safety awareness;Decreased strength;Hypomobility;Impaired flexibility;Impaired UE functional use;Pain;Postural dysfunction;Improper body mechanics   Rehab Potential Poor   Clinical Impairments Affecting Rehab Potential Negative: shoudler pain >6 months. Postive: motivated to get better, high level of function prior to injury.    PT Frequency 2x / week   PT Duration 8 weeks   PT Treatment/Interventions ADLs/Self Care Home Management;Electrical Stimulation;Cryotherapy;Iontophoresis 4mg /ml Dexamethasone;Moist Heat;Ultrasound;Therapeutic activities;Therapeutic exercise;Patient/family education;Manual techniques;Passive range of motion;Dry needling;Energy conservation   PT Next Visit Plan Shoulder strengthening/ROM, Manual therapy    Consulted and Agree  with Plan of Care Patient        Problem List Patient Active Problem List   Diagnosis Date Noted  . Enlarged uterus 04/07/2015  . Intramural leiomyoma of uterus 04/07/2015  . 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 Arthralgia of shoulder 04/14/2014  . Clinical depression 04/14/2014  . HLD (hyperlipidemia) 04/14/2014  . Adult hypothyroidism 04/14/2014   Gorden Harms. Tortorici, PT, DPT [phone]  Tortorici,Ashley 08/04/2015, 4:31 PM  [place] [facility] MAIN [facility] SERVICES [address] [place], [place], [address] Phone: [phone]  Fax:  [phone]

## 2015-08-08 ENCOUNTER — Ambulatory Visit: Payer: BLUE CROSS/BLUE SHIELD

## 2015-08-08 DIAGNOSIS — R29898 Other symptoms and signs involving the musculoskeletal system: Secondary | ICD-10-CM

## 2015-08-08 DIAGNOSIS — M25612 Stiffness of left shoulder, not elsewhere classified: Secondary | ICD-10-CM

## 2015-08-08 DIAGNOSIS — M25512 Pain in left shoulder: Secondary | ICD-10-CM

## 2015-08-08 NOTE — Therapy (Addendum)
[place] MAIN [facility] SERVICES [address] [place], [place], [address] Phone: [phone]   Fax:  [phone]  Physical Therapy Treatment  Patient Details  Name: Sharon Hicks MRN: [id number] Date of Birth: 10-03-1961 Referring Provider:  Corky Mull, MD  Encounter Date: 08/08/2015      PT End of Session - 08/08/15 1853    Visit Number 7   Number of Visits 17   Date for PT Re-Evaluation 09/06/15   PT Start Time 2263   PT Stop Time 1818   PT Time Calculation (min) 42 min   Activity Tolerance Patient tolerated treatment well   Behavior During Therapy [facility] for tasks assessed/performed      Past Medical History  Diagnosis Date  . Fibroids   . Hypothyroidism   . Elevated lipids   . Abnormal Pap smear     h/o ascus-h pap, colpo negative  . Anemia     h/o  . Anxiety     stressors with fahter aging  . Obesity   . Hearing loss in left ear     Past Surgical History  Procedure Laterality Date  . Excision of thyroid nodule  2/00  . Cholecystectomy, laparoscopic  4/08  . Appendectomy  4/08  . Cardiac evaluation  4/07    stress test, echo, EKG-mild MVP  . Eye surgery Right 11/2014    There were no vitals filed for this visit.  Visit Diagnosis:  Left arm weakness  Left shoulder pain  Decreased ROM of left shoulder      Subjective Assessment - 08/08/15 1852    Subjective Pt reports she started having dull pain with occasional throbbing in her left shoulder yesterday with no apparent cause. She has been compliant with HEP and currently has throbbing in her left shoulder.     Pertinent History No surgeries to the shoulder or history Cancer. Patient also has a history of hypothyroidism and anxiety.    Limitations Lifting;House hold activities   How long can you sit comfortably? not a problem    How long can you stand comfortably? not a problem    How long can you walk comfortably? as long as needed   Diagnostic tests MRI shows  tear in the L Supraspinatus muscles    Patient Stated Goals be able to snap bra, be able to sleep on the L side and lift objects into abduction with the L UE .    Currently in Pain? Yes   Pain Score --  no rating provided   Pain Location Shoulder   Pain Orientation Left   Pain Descriptors / Indicators Dull;Throbbing          Manual therapy: Soft tissue mobilizationto L deltoid and biceps including efflurage, muscle stripping and ischemic compression  Therex: UBE x 3 min warm up no charge wall slide with lift off 2x10  Shoulder ER with 2lbs 2x10 Shoulder flexion in SL 2lbs 2x10 (to 120deg) D1 shoudler extension with red band 3x10 D2 shoulder flexion 0bs AROM 2x10 D2 shoulder flexion with yellow band 2x10 Push up with plus on wall 2x10 Pt required min - mod verbal, visual and tactile cues for correct technique.                         PT Education - 08/08/15 1853    Education provided Yes   Education Details plan of care and HEP   Person(s) Educated Patient  Methods Explanation   Comprehension Verbalized understanding             PT Long Term Goals - 07/13/15 0746    PT LONG TERM GOAL #1   Title Patient will be independent with HEP to improve shoulder ROM and Strength to allow improved function with home activities by 09/06/15.   Time 8   Period Weeks   Status New   PT LONG TERM GOAL #2   Title Patient will improve score on the Quick DASH to <10 to indicate improved function with ADLs and return to PLOF by 09/06/15.   Time 8   Period Weeks   Status New   PT LONG TERM GOAL #3   Title Patient will improve Shoulder IR to at least 70 Degrees to allow patient to don/doff undergarments without difficulty by 09/06/15.   Time 8   Period Weeks   Status New   PT LONG TERM GOAL #4   Title Patient will increase shoulder ER to at least 60 degrees to allow her to wash and brush hair with greater ease by 09/06/15.   PT LONG TERM GOAL #5   Title Patient  will improve shoulder strength to 5/5 for all motions to allow her to lift heavy objects for increased independence with all home tasks by 09/06/15.   Time 8   Period Weeks   Status New   Additional Long Term Goals   Additional Long Term Goals Yes   PT LONG TERM GOAL #6   Title Patient will report a worst pain of 3/10 on VAS in     left shoulder  to improve tolerance with ADLs and reduced symptoms with activities by 09/06/15   Time 8   Period Weeks   Status New               Plan - 08/08/15 1858    Clinical Impression Statement Pt responded well today's session with relief of dull and throbbing symptoms. Pt was able to perform progression of strength exercises with min verbal and tactile cues and reported decreased pain.  pt has myofascial trigger points along her left biceps which improve with manual therapy.    Pt will benefit from skilled therapeutic intervention in order to improve on the following deficits Decreased knowledge of precautions;Decreased range of motion;Decreased safety awareness;Decreased strength;Hypomobility;Impaired flexibility;Impaired UE functional use;Pain;Postural dysfunction;Improper body mechanics   Rehab Potential Poor   Clinical Impairments Affecting Rehab Potential Negative: shoudler pain >6 months. Postive: motivated to get better, high level of function prior to injury.    PT Frequency 2x / week   PT Duration 8 weeks   PT Treatment/Interventions ADLs/Self Care Home Management;Electrical Stimulation;Cryotherapy;Iontophoresis 4mg /ml Dexamethasone;Moist Heat;Ultrasound;Therapeutic activities;Therapeutic exercise;Patient/family education;Manual techniques;Passive range of motion;Dry needling;Energy conservation   PT Next Visit Plan Shoulder strengthening/ROM, Manual therapy    Consulted and Agree with Plan of Care Patient        Problem List Patient Active Problem List   Diagnosis Date Noted  . Enlarged uterus 04/07/2015  . Intramural leiomyoma of  uterus 04/07/2015  . 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 Arthralgia of shoulder 04/14/2014  . Clinical depression 04/14/2014  . HLD (hyperlipidemia) 04/14/2014  . Adult hypothyroidism 04/14/2014   Renford Dills, SPT This entire session was performed under direct supervision and direction of a licensed therapist/therapist assistant . I have personally read, edited and approve of the note as written.  Gorden Harms. Tortorici, PT, DPT [phone]  Tortorici,Ashley 08/09/2015, 10:06 AM  [place]  [place] MAIN [facility] SERVICES [place], [place], [address] Phone: [phone]   Fax:  [phone]

## 2015-08-08 NOTE — Patient Instructions (Signed)
[email or web address] D2 flexion with yellow band 2x10 D1 extension with red band 2x10 Wall slide with lift 2x10

## 2015-08-10 ENCOUNTER — Ambulatory Visit: Payer: BLUE CROSS/BLUE SHIELD

## 2015-08-10 DIAGNOSIS — M25612 Stiffness of left shoulder, not elsewhere classified: Secondary | ICD-10-CM

## 2015-08-10 DIAGNOSIS — R29898 Other symptoms and signs involving the musculoskeletal system: Secondary | ICD-10-CM | POA: Diagnosis not present

## 2015-08-10 DIAGNOSIS — M25512 Pain in left shoulder: Secondary | ICD-10-CM

## 2015-08-11 NOTE — Therapy (Signed)
[place] MAIN [facility] SERVICES [place], [place], [address] Phone: [phone]   Fax:  [phone]  Physical Therapy Treatment  Patient Details  Name: Sharon Hicks MRN: [id number] Date of Birth: 18-Sep-1961 Referring Provider:  Corky Mull, MD  Encounter Date: 08/10/2015      PT End of Session - 08/11/15 0830    Visit Number 8   Number of Visits 17   Date for PT Re-Evaluation 09/06/15   PT Start Time 3354   PT Stop Time 1816   PT Time Calculation (min) 42 min   Activity Tolerance Patient tolerated treatment well   Behavior During Therapy [facility] for tasks assessed/performed      Past Medical History  Diagnosis Date  . Fibroids   . Hypothyroidism   . Elevated lipids   . Abnormal Pap smear     h/o ascus-h pap, colpo negative  . Anemia     h/o  . Anxiety     stressors with fahter aging  . Obesity   . Hearing loss in left ear     Past Surgical History  Procedure Laterality Date  . Excision of thyroid nodule  2/00  . Cholecystectomy, laparoscopic  4/08  . Appendectomy  4/08  . Cardiac evaluation  4/07    stress test, echo, EKG-mild MVP  . Eye surgery Right 11/2014    There were no vitals filed for this visit.  Visit Diagnosis:  Left arm weakness  Left shoulder pain  Decreased ROM of left shoulder      Subjective Assessment - 08/11/15 0828    Subjective Pt relates she is doing well this afternoon and has less throbbing pain in her shoulder compared to her last session.  Pt has been compliant with HEP and experiences minimal pain with new exercises.   Pertinent History No surgeries to the shoulder or history Cancer. Patient also has a history of hypothyroidism and anxiety.    Limitations Lifting;House hold activities   How long can you sit comfortably? not a problem    How long can you stand comfortably? not a problem    How long can you walk comfortably? as long as needed   Diagnostic tests MRI shows tear  in the L Supraspinatus muscles    Patient Stated Goals be able to snap bra, be able to sleep on the L side and lift objects into abduction with the L UE .    Currently in Pain? Yes   Pain Score --  no value provided    Pain Location Shoulder   Pain Orientation Left   Pain Descriptors / Indicators Throbbing       manual therapy: reassessed shoulder ROM to assess progress towards goals  L shoulder AROM in sitting Flexion 180 degrees Abduction:170 degrees  Internal rotation: left thumb to iliac crest Left shoulder AROM in supine with shoulder at 90 degrees of abduction External rotation: 40 degrees  Internal rotation:45 degrees  STM to left teres minor (ischemic compression) Internal rotation after soft tissue mobilization (ischemic compression) to left teres minor: 50 degrees   There ex: Instructed pt on how to perform self-massage to biceps and teres minor with tennis ball Scapular hugs with red band x10, pt experienced pain in anterior shoulder Scapular punches  with red band x10 Scapular punches with greed band x10 Left shoulder internal rotation AAROM with strap x10 Left shoulder internal/external rotation with red band x10 Pt required verbal, tactile and visual cues for  correct technique                               PT Long Term Goals - 07/13/15 0746    PT LONG TERM GOAL #1   Title Patient will be independent with HEP to improve shoulder ROM and Strength to allow improved function with home activities by 09/06/15.   Time 8   Period Weeks   Status New   PT LONG TERM GOAL #2   Title Patient will improve score on the Quick DASH to <10 to indicate improved function with ADLs and return to PLOF by 09/06/15.   Time 8   Period Weeks   Status New   PT LONG TERM GOAL #3   Title Patient will improve Shoulder IR to at least 70 Degrees to allow patient to don/doff undergarments without difficulty by 09/06/15.   Time 8   Period Weeks   Status New   PT  LONG TERM GOAL #4   Title Patient will increase shoulder ER to at least 60 degrees to allow her to wash and brush hair with greater ease by 09/06/15.   PT LONG TERM GOAL #5   Title Patient will improve shoulder strength to 5/5 for all motions to allow her to lift heavy objects for increased independence with all home tasks by 09/06/15.   Time 8   Period Weeks   Status New   Additional Long Term Goals   Additional Long Term Goals Yes   PT LONG TERM GOAL #6   Title Patient will report a worst pain of 3/10 on VAS in     left shoulder  to improve tolerance with ADLs and reduced symptoms with activities by 09/06/15   Time 8   Period Weeks   Status New               Plan - 08/11/15 2505    Clinical Impression Statement Pt is progressing well with PT and has made improvements in ROM and with less pain.  Pt's greatest deficit is shoulder internal rotation compared to other motions which has improved since the start of PT. Instructed pt on how to perform self-soft tissue mobilization due to manage tightness and decrease pain while performing HEP.     Pt will benefit from skilled therapeutic intervention in order to improve on the following deficits Decreased knowledge of precautions;Decreased range of motion;Decreased safety awareness;Decreased strength;Hypomobility;Impaired flexibility;Impaired UE functional use;Pain;Postural dysfunction;Improper body mechanics   Rehab Potential Poor   Clinical Impairments Affecting Rehab Potential Negative: shoudler pain >6 months. Postive: motivated to get better, high level of function prior to injury.    PT Frequency 2x / week   PT Duration 8 weeks   PT Treatment/Interventions ADLs/Self Care Home Management;Electrical Stimulation;Cryotherapy;Iontophoresis 4mg /ml Dexamethasone;Moist Heat;Ultrasound;Therapeutic activities;Therapeutic exercise;Patient/family education;Manual techniques;Passive range of motion;Dry needling;Energy conservation   PT Next Visit  Plan Shoulder strengthening/ROM, Manual therapy    Consulted and Agree with Plan of Care Patient        Problem List Patient Active Problem List   Diagnosis Date Noted  . Enlarged uterus 04/07/2015  . Intramural leiomyoma of uterus 04/07/2015  . 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 Arthralgia of shoulder 04/14/2014  . Clinical depression 04/14/2014  . HLD (hyperlipidemia) 04/14/2014  . Adult hypothyroidism 04/14/2014   Renford Dills, SPT This entire session was performed under direct supervision and direction of a licensed therapist/therapist assistant . I have personally read, edited  and approve of the note as written. Gorden Harms. Tortorici, PT, DPT [phone]  Tortorici,Ashley 08/11/2015, 10:56 AM  [place] MAIN [facility] SERVICES [address] [place], [place], [address] Phone: [phone]   Fax:  [phone]

## 2015-08-11 NOTE — Patient Instructions (Signed)
[email or web address] Serratus anterior punches with green band 2x10

## 2015-08-16 ENCOUNTER — Ambulatory Visit: Payer: BLUE CROSS/BLUE SHIELD | Attending: Surgery

## 2015-08-16 DIAGNOSIS — M25612 Stiffness of left shoulder, not elsewhere classified: Secondary | ICD-10-CM

## 2015-08-16 DIAGNOSIS — M25512 Pain in left shoulder: Secondary | ICD-10-CM

## 2015-08-16 DIAGNOSIS — M7582 Other shoulder lesions, left shoulder: Secondary | ICD-10-CM | POA: Insufficient documentation

## 2015-08-16 DIAGNOSIS — R29898 Other symptoms and signs involving the musculoskeletal system: Secondary | ICD-10-CM | POA: Insufficient documentation

## 2015-08-17 NOTE — Patient Instructions (Signed)
[email or web address] Shoulder extension with wand 5s x 10 Shoulder IR with towel 5s x 5

## 2015-08-17 NOTE — Therapy (Signed)
[facility] The MAIN [facility] SERVICES [address] [place], [place], [address] Phone: [phone]   Fax:  [phone]  Physical Therapy Treatment  Patient Details  Name: Sharon Hicks MRN: [id number] Date of Birth: Feb 18, 1961 Referring Provider:  Christena Flake, MD  Encounter Date: 08/16/2015      PT End of Session - 08/17/15 1406    Visit Number 9   Number of Visits 17   Date for PT Re-Evaluation 09/06/15   PT Start Time 1745   PT Stop Time 1829   PT Time Calculation (min) 44 min   Activity Tolerance Patient tolerated treatment well   Behavior During Therapy [facility] for tasks assessed/performed      Past Medical History  Diagnosis Date  . Fibroids   . Hypothyroidism   . Elevated lipids   . Abnormal Pap smear     h/o ascus-h pap, colpo negative  . Anemia     h/o  . Anxiety     stressors with fahter aging  . Obesity   . Hearing loss in left ear     Past Surgical History  Procedure Laterality Date  . Excision of thyroid nodule  2/00  . Cholecystectomy, laparoscopic  4/08  . Appendectomy  4/08  . Cardiac evaluation  4/07    stress test, echo, EKG-mild MVP  . Eye surgery Right 11/2014    There were no vitals filed for this visit.  Visit Diagnosis:  Left shoulder pain  Left arm weakness  Decreased ROM of left shoulder      Subjective Assessment - 08/17/15 1404    Subjective pt reports she continues to do well. she had f/u with Dr. Joice Lofts who recommends more therapy. pt reports she has noticed her LLE feels stronger than her RUE at times. pt primary residual compliant is lack of IR to hook her bra   Pertinent History No surgeries to the shoulder or history Cancer. Patient also has a history of hypothyroidism and anxiety.    Limitations Lifting;House hold activities   Patient Stated Goals be able to snap bra, be able to sleep on the L side and lift objects into abduction with the L UE .    Currently in Pain? Yes   Pain Score 2    Pain Location --  L shoulder      Ionto: Skin prep with ETOH swab. ionto STAT patch applied to     L biceps tendon               With 4mg /ml Dexamethasone. Pt instructed on wear time, potential skin irritations and precautions    Therex: On matrix cable column: Lat pull downs: 7.5lbs 2x12 Cross over low row 2.5lbs 2x10 D2 extension (chop with both UE) 7.5lbs 2x10 bilaterally D2 flexion with both UE 7.5.bs 2x10 bilaterally Shoulder (L) horiz abduction 2.5lbs 2x10 Eccentric biceps 7.5lbs 2x10 Chest press with + 7.5lbs 2x10 Shoulder extension with wand 2x10 Shoulder IR with towel x 10 T spine extension in sitting over towel x 10 Pt requires min -mod cues for proper exercise performance   Manual therapy: Shoulder extension with over pressure 2x10 PA GH joint mobs grade 3 20s x 3 CFM over biceps tendon x 90s                               PT Long Term Goals - 07/13/15 0746    PT LONG TERM GOAL #  1   Title Patient will be independent with HEP to improve shoulder ROM and Strength to allow improved function with home activities by 09/06/15.   Time 8   Period Weeks   Status New   PT LONG TERM GOAL #2   Title Patient will improve score on the Quick DASH to <10 to indicate improved function with ADLs and return to PLOF by 09/06/15.   Time 8   Period Weeks   Status New   PT LONG TERM GOAL #3   Title Patient will improve Shoulder IR to at least 70 Degrees to allow patient to don/doff undergarments without difficulty by 09/06/15.   Time 8   Period Weeks   Status New   PT LONG TERM GOAL #4   Title Patient will increase shoulder ER to at least 60 degrees to allow her to wash and brush hair with greater ease by 09/06/15.   PT LONG TERM GOAL #5   Title Patient will improve shoulder strength to 5/5 for all motions to allow her to lift heavy objects for increased independence with all home tasks by 09/06/15.   Time 8   Period Weeks   Status New   Additional Long  Term Goals   Additional Long Term Goals Yes   PT LONG TERM GOAL #6   Title Patient will report a worst pain of 3/10 on VAS in     left shoulder  to improve tolerance with ADLs and reduced symptoms with activities by 09/06/15   Time 8   Period Weeks   Status New               Plan - 08/17/15 1406    Clinical Impression Statement end of session focused on increasing IR of the shoulder. pt has constriction of anterior capsule of shoulder, improved with repeated shoulder extension stretching this area. pt also had improved shoulder flexion with reduced pain after simple self T spine mobs. pt did well with progression of strengthening exercises which included more functional movement today. she reported no increased pain only muscle fatigue. ionto patch applied to L biceps tendon today due to continued irriation of this area   Pt will benefit from skilled therapeutic intervention in order to improve on the following deficits Decreased knowledge of precautions;Decreased range of motion;Decreased safety awareness;Decreased strength;Hypomobility;Impaired flexibility;Impaired UE functional use;Pain;Postural dysfunction;Improper body mechanics   Rehab Potential Poor   Clinical Impairments Affecting Rehab Potential Negative: shoudler pain >6 months. Postive: motivated to get better, high level of function prior to injury.    PT Frequency 2x / week   PT Duration 8 weeks   PT Treatment/Interventions ADLs/Self Care Home Management;Electrical Stimulation;Cryotherapy;Iontophoresis 4mg /ml Dexamethasone;Moist Heat;Ultrasound;Therapeutic activities;Therapeutic exercise;Patient/family education;Manual techniques;Passive range of motion;Dry needling;Energy conservation   PT Next Visit Plan Shoulder strengthening/ROM, Manual therapy    Consulted and Agree with Plan of Care Patient        Problem List Patient Active Problem List   Diagnosis Date Noted  . Enlarged uterus 04/07/2015  . Intramural  leiomyoma of uterus 04/07/2015  . 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 Arthralgia of shoulder 04/14/2014  . Clinical depression 04/14/2014  . HLD (hyperlipidemia) 04/14/2014  . Adult hypothyroidism 04/14/2014   Carlyon Shadow. Jedidiah Demartini, PT, DPT [phone]  Delma Drone 08/17/2015, 2:09 PM  [place] MAIN [facility] SERVICES [address] [place], [place], [address] Phone: [phone]   Fax:  [phone]

## 2015-08-23 ENCOUNTER — Ambulatory Visit: Payer: BLUE CROSS/BLUE SHIELD

## 2015-08-23 DIAGNOSIS — M25512 Pain in left shoulder: Secondary | ICD-10-CM | POA: Diagnosis not present

## 2015-08-23 DIAGNOSIS — R29898 Other symptoms and signs involving the musculoskeletal system: Secondary | ICD-10-CM

## 2015-08-23 DIAGNOSIS — M25612 Stiffness of left shoulder, not elsewhere classified: Secondary | ICD-10-CM

## 2015-08-24 NOTE — Patient Instructions (Signed)
[email or web address] Low shoulder extension with band 2x10 Biceps MET stretch x 2 min Lat pull down with band 2x10

## 2015-08-24 NOTE — Therapy (Signed)
[place] MAIN [facility] SERVICES [place], [place], [address] Phone: [phone]   Fax:  [phone]  Physical Therapy Treatment  Patient Details  Name: Sharon Hicks MRN: [id number] Date of Birth: July 09, 1961 Referring Provider:  Corky Mull, MD  Encounter Date: 08/23/2015      PT End of Session - 08/24/15 1054    Visit Number 10   Number of Visits 17   Date for PT Re-Evaluation 09/06/15   PT Start Time 7616   PT Stop Time 1815   PT Time Calculation (min) 45 min   Activity Tolerance Patient tolerated treatment well   Behavior During Therapy [facility] for tasks assessed/performed      Past Medical History  Diagnosis Date  . Fibroids   . Hypothyroidism   . Elevated lipids   . Abnormal Pap smear     h/o ascus-h pap, colpo negative  . Anemia     h/o  . Anxiety     stressors with fahter aging  . Obesity   . Hearing loss in left ear     Past Surgical History  Procedure Laterality Date  . Excision of thyroid nodule  2/00  . Cholecystectomy, laparoscopic  4/08  . Appendectomy  4/08  . Cardiac evaluation  4/07    stress test, echo, EKG-mild MVP  . Eye surgery Right 11/2014    There were no vitals filed for this visit.  Visit Diagnosis:  Left shoulder pain  Left arm weakness  Decreased ROM of left shoulder      Subjective Assessment - 08/24/15 1052    Subjective Pt relates she feels better and her shoulder feels normal except when she reaches behind her back to zip up a dress.     Pertinent History No surgeries to the shoulder or history Cancer. Patient also has a history of hypothyroidism and anxiety.    Limitations Lifting;House hold activities   Patient Stated Goals be able to snap bra, be able to sleep on the L side and lift objects into abduction with the L UE .    Currently in Pain? No/denies      There ex: Reverse cable cross over with 1 plate 2x10 Cross over with yellow band x10 Cable lat pull down  with 2 plates 2x10 Lat pull down with yellow band x10 Standing low shoulder extension with yellow band Wall push-ups with plus x10 Left biceps stretch x30 sec left biceps MET stretch x3 minutes pt demonstrated improved shoulder internal ROM after MET technique to stretch biceps pt required cueing to decrease shoulder shrug and for correct exercise technique  Ionto: Skin prep with ETOH swab. ionto STAT patch applied to  L biceps tendon  With 53m/ml Dexamethasone. Pt instructed on wear time, potential skin irritations and precautions                             PT Education - 08/24/15 1054    Education provided Yes   Education Details plan of care and new exercises for HEP   Person(s) Educated Patient   Methods Explanation   Comprehension Verbalized understanding             PT Long Term Goals - 07/13/15 0746    PT LONG TERM GOAL #1   Title Patient will be independent with HEP to improve shoulder ROM and Strength to allow improved function with home activities by 09/06/15.   Time  8   Period Weeks   Status New   PT LONG TERM GOAL #2   Title Patient will improve score on the Quick DASH to <10 to indicate improved function with ADLs and return to PLOF by 09/06/15.   Time 8   Period Weeks   Status New   PT LONG TERM GOAL #3   Title Patient will improve Shoulder IR to at least 70 Degrees to allow patient to don/doff undergarments without difficulty by 09/06/15.   Time 8   Period Weeks   Status New   PT LONG TERM GOAL #4   Title Patient will increase shoulder ER to at least 60 degrees to allow her to wash and brush hair with greater ease by 09/06/15.   PT LONG TERM GOAL #5   Title Patient will improve shoulder strength to 5/5 for all motions to allow her to lift heavy objects for increased independence with all home tasks by 09/06/15.   Time 8   Period Weeks   Status New   Additional Long Term Goals   Additional Long Term Goals Yes   PT LONG TERM  GOAL #6   Title Patient will report a worst pain of 3/10 on VAS in     left shoulder  to improve tolerance with ADLs and reduced symptoms with activities by 09/06/15   Time 8   Period Weeks   Status New               Plan - 08/24/15 1055    Clinical Impression Statement pt responded well to strength progression and biceps MET with improved shoulder internal rotation.  Pt requires occasional cues to decrease left shoulder shrug and pt is more aware of when she shrugs her left shoulder.      Pt will benefit from skilled therapeutic intervention in order to improve on the following deficits Decreased knowledge of precautions;Decreased range of motion;Decreased safety awareness;Decreased strength;Hypomobility;Impaired flexibility;Impaired UE functional use;Pain;Postural dysfunction;Improper body mechanics   Rehab Potential Poor   Clinical Impairments Affecting Rehab Potential Negative: shoudler pain >6 months. Postive: motivated to get better, high level of function prior to injury.    PT Frequency 2x / week   PT Duration 8 weeks   PT Treatment/Interventions ADLs/Self Care Home Management;Electrical Stimulation;Cryotherapy;Iontophoresis 47m/ml Dexamethasone;Moist Heat;Ultrasound;Therapeutic activities;Therapeutic exercise;Patient/family education;Manual techniques;Passive range of motion;Dry needling;Energy conservation   PT Next Visit Plan Shoulder strengthening/ROM, Manual therapy    Consulted and Agree with Plan of Care Patient        Problem List Patient Active Problem List   Diagnosis Date Noted  . Enlarged uterus 04/07/2015  . Intramural leiomyoma of uterus 04/07/2015  . 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 Arthralgia of shoulder 04/14/2014  . Clinical depression 04/14/2014  . HLD (hyperlipidemia) 04/14/2014  . Adult hypothyroidism 04/14/2014   JRenford Dills SPT This entire session was performed under direct supervision and direction of a licensed  therapist/therapist assistant . I have personally read, edited and approve of the note as written. AGorden Harms Tortorici, PT, DPT #[id number] Tortorici,Ashley 08/25/2015, 9:26 AM  [place] [facility] [address] [address]Phone: [phone]  Fax:  [phone]

## 2015-08-25 ENCOUNTER — Ambulatory Visit: Payer: BLUE CROSS/BLUE SHIELD

## 2015-08-25 DIAGNOSIS — R29898 Other symptoms and signs involving the musculoskeletal system: Secondary | ICD-10-CM

## 2015-08-25 DIAGNOSIS — M25512 Pain in left shoulder: Secondary | ICD-10-CM

## 2015-08-25 DIAGNOSIS — M25612 Stiffness of left shoulder, not elsewhere classified: Secondary | ICD-10-CM

## 2015-08-25 NOTE — Patient Instructions (Signed)
[email or web address] Prone horizontal abduction 2x10 Prone extension 2x10 Prone lower trap 2x10

## 2015-08-25 NOTE — Therapy (Signed)
[place] MAIN [facility] SERVICES [address] [place], [place], [address] Phone: [phone]   Fax:  [phone]  Physical Therapy Treatment  Patient Details  Name: Sharon Hicks MRN: [id number] Date of Birth: 07-09-1961 No Data Recorded  Encounter Date: 08/25/2015      PT End of Session - 08/25/15 1931    Visit Number 11   Number of Visits 17   Date for PT Re-Evaluation 09/06/15   PT Start Time 1730   PT Stop Time 1815   PT Time Calculation (min) 45 min   Activity Tolerance Patient tolerated treatment well   Behavior During Therapy [facility] for tasks assessed/performed      Past Medical History  Diagnosis Date  . Fibroids   . Hypothyroidism   . Elevated lipids   . Abnormal Pap smear     h/o ascus-h pap, colpo negative  . Anemia     h/o  . Anxiety     stressors with fahter aging  . Obesity   . Hearing loss in left ear     Past Surgical History  Procedure Laterality Date  . Excision of thyroid nodule  2/00  . Cholecystectomy, laparoscopic  4/08  . Appendectomy  4/08  . Cardiac evaluation  4/07    stress test, echo, EKG-mild MVP  . Eye surgery Right 11/2014    There were no vitals filed for this visit.  Visit Diagnosis:  Left shoulder pain  Left arm weakness  Decreased ROM of left shoulder      Subjective Assessment - 08/25/15 1925    Subjective Pt denies any pain currently.  she notes her main deficit is internal rotation which is slowly improving   Pertinent History No surgeries to the shoulder or history Cancer. Patient also has a history of hypothyroidism and anxiety.    Limitations Lifting;House hold activities   Patient Stated Goals be able to snap bra, be able to sleep on the L side and lift objects into abduction with the L UE .    Currently in Pain? No/denies   Pain Score 0-No pain        Therex: Prone Horizontal shoulder abduction 2x10 Prone shoulder extension 2x10 Prone shoulder flexion (Y) eccentric  2x10   Cross over with yellow band 2x10 Lat pull down with yellow band 2x10 left biceps MET stretch x3 minutes pt demonstrated improved shoulder internal ROM after MET technique to stretch biceps pt required verbal and tactile cueing to decrease shoulder shrug and for correct exercise technique pt required rest breaks during prone exercises between sets due to fatigue    Manual therapy: Patient received dry needling therapy education and acknowledged understanding of risks and benefits of dry needling therapy prior to receiving treatment. Patient voiced understanding of treatment options and elected to proceed with dry needling therapy.    Supervising PT performed Trigger point dry needling L biceps . Twitch response and deep ache noted Improved pain free flexion and IR noted following dry needling                        PT Education - 08/25/15 1928    Education provided Yes   Education Details plan of care and new exercises for HEP   Person(s) Educated Patient   Methods Explanation   Comprehension Verbalized understanding             PT Long Term Goals - 07/13/15 0746    PT LONG  TERM GOAL #1   Title Patient will be independent with HEP to improve shoulder ROM and Strength to allow improved function with home activities by 09/06/15.   Time 8   Period Weeks   Status New   PT LONG TERM GOAL #2   Title Patient will improve score on the Quick DASH to <10 to indicate improved function with ADLs and return to PLOF by 09/06/15.   Time 8   Period Weeks   Status New   PT LONG TERM GOAL #3   Title Patient will improve Shoulder IR to at least 70 Degrees to allow patient to don/doff undergarments without difficulty by 09/06/15.   Time 8   Period Weeks   Status New   PT LONG TERM GOAL #4   Title Patient will increase shoulder ER to at least 60 degrees to allow her to wash and brush hair with greater ease by 09/06/15.   PT LONG TERM GOAL #5   Title Patient will  improve shoulder strength to 5/5 for all motions to allow her to lift heavy objects for increased independence with all home tasks by 09/06/15.   Time 8   Period Weeks   Status New   Additional Long Term Goals   Additional Long Term Goals Yes   PT LONG TERM GOAL #6   Title Patient will report a worst pain of 3/10 on VAS in     left shoulder  to improve tolerance with ADLs and reduced symptoms with activities by 09/06/15   Time 8   Period Weeks   Status New               Plan - 08/25/15 1932    Clinical Impression Statement pt responding well to dry needling with improved ROM. pt did well with progression of therex for scapular and shoulder strengthening without having increased pain.     Pt will benefit from skilled therapeutic intervention in order to improve on the following deficits Decreased knowledge of precautions;Decreased range of motion;Decreased safety awareness;Decreased strength;Hypomobility;Impaired flexibility;Impaired UE functional use;Pain;Postural dysfunction;Improper body mechanics   Rehab Potential Poor   Clinical Impairments Affecting Rehab Potential Negative: shoudler pain >6 months. Postive: motivated to get better, high level of function prior to injury.    PT Frequency 2x / week   PT Duration 8 weeks   PT Treatment/Interventions ADLs/Self Care Home Management;Electrical Stimulation;Cryotherapy;Iontophoresis 29m/ml Dexamethasone;Moist Heat;Ultrasound;Therapeutic activities;Therapeutic exercise;Patient/family education;Manual techniques;Passive range of motion;Dry needling;Energy conservation   PT Next Visit Plan Shoulder strengthening/ROM, Manual therapy    Consulted and Agree with Plan of Care Patient        Problem List Patient Active Problem List   Diagnosis Date Noted  . Enlarged uterus 04/07/2015  . Intramural leiomyoma of uterus 04/07/2015  . 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 Arthralgia of shoulder 04/14/2014  . Clinical depression  04/14/2014  . HLD (hyperlipidemia) 04/14/2014  . Adult hypothyroidism 04/14/2014   JRenford Dills SPT This entire session was performed under direct supervision and direction of a licensed therapist/therapist assistant . I have personally read, edited and approve of the note as written. AGorden Harms Tortorici, PT, DPT #[id number] Tortorici,Ashley 08/26/2015, 3:49 PM  [place] [facility]SERVICES [address] [address]Phone: [phone]  Fax:  [phone] Name: Sharon WINKLESMRN: [id number] of Birth: 8May 18, 1962

## 2015-08-29 ENCOUNTER — Ambulatory Visit: Payer: BLUE CROSS/BLUE SHIELD

## 2015-08-31 ENCOUNTER — Ambulatory Visit: Payer: BLUE CROSS/BLUE SHIELD

## 2015-08-31 DIAGNOSIS — M25512 Pain in left shoulder: Secondary | ICD-10-CM | POA: Diagnosis not present

## 2015-08-31 DIAGNOSIS — R29898 Other symptoms and signs involving the musculoskeletal system: Secondary | ICD-10-CM

## 2015-08-31 DIAGNOSIS — M25612 Stiffness of left shoulder, not elsewhere classified: Secondary | ICD-10-CM

## 2015-09-01 NOTE — Therapy (Signed)
[place] MAIN [facility] SERVICES [address] [place], [place], [address] Phone: [phone]   Fax:  [phone]  Physical Therapy Treatment  Patient Details  Name: Sharon Hicks MRN: [id number] Date of Birth: 1961/05/25 No Data Recorded  Encounter Date: 08/31/2015      PT End of Session - 09/01/15 0959    Visit Number 12   Number of Visits 17   Date for PT Re-Evaluation 09/06/15   PT Start Time 4680   PT Stop Time 1815   PT Time Calculation (min) 45 min   Activity Tolerance Patient tolerated treatment well   Behavior During Therapy [facility] for tasks assessed/performed      Past Medical History  Diagnosis Date  . Fibroids   . Hypothyroidism   . Elevated lipids   . Abnormal Pap smear     h/o ascus-h pap, colpo negative  . Anemia     h/o  . Anxiety     stressors with fahter aging  . Obesity   . Hearing loss in left ear     Past Surgical History  Procedure Laterality Date  . Excision of thyroid nodule  2/00  . Cholecystectomy, laparoscopic  4/08  . Appendectomy  4/08  . Cardiac evaluation  4/07    stress test, echo, EKG-mild MVP  . Eye surgery Right 11/2014    There were no vitals filed for this visit.  Visit Diagnosis:  Left shoulder pain  Left arm weakness  Decreased ROM of left shoulder      Subjective Assessment - 09/01/15 0957    Subjective Pt reports she is doing well and currently has no pain.  Pt feels like her internal rotation may have improved slightly since her last visit.     Pertinent History No surgeries to the shoulder or history Cancer. Patient also has a history of hypothyroidism and anxiety.    Limitations Lifting;House hold activities   Patient Stated Goals be able to snap bra, be able to sleep on the L side and lift objects into abduction with the L UE .    Currently in Pain? No/denies   Pain Score 0-No pain      Manual therapy: Soft tissue mobilization (effleurage, muscle stripping, ischemic  compression) to left biceps to decrease tension, myofascial trigger point restrictions Less tension was noted in biceps after soft tissue mobilization  Biceps MET x2 min, pt reported comfortable stretch  Pt reported minimal pain in the anterior aspect of her shoulder during internal rotation after biceps stretch and soft tissue mobilization and instead felt restriction/pain in the posterior aspect of her shoulder Ischemic compression to teres minor/major/lat area during horizontal adduction stretch, and shoulder flexion  There ex: Standing lat stretch 3x20 sec Sitting prayer stretch 3x20 sesc Pt required verbal and visual cues for correct stretch technique  Ionto: Skin prep with ETOH swab. ionto STAT patch applied to  L biceps tendon  With 56m/ml Dexamethasone. Pt instructed on wear time, potential skin irritations and precautions                            PT Education - 09/01/15 0958    Education provided Yes   Education Details plan of care and benefits of soft tissue mobilization to biceps to decrease tension    Person(s) Educated Patient   Methods Explanation   Comprehension Verbalized understanding  PT Long Term Goals - 07/13/15 0746    PT LONG TERM GOAL #1   Title Patient will be independent with HEP to improve shoulder ROM and Strength to allow improved function with home activities by 09/06/15.   Time 8   Period Weeks   Status New   PT LONG TERM GOAL #2   Title Patient will improve score on the Quick DASH to <10 to indicate improved function with ADLs and return to PLOF by 09/06/15.   Time 8   Period Weeks   Status New   PT LONG TERM GOAL #3   Title Patient will improve Shoulder IR to at least 70 Degrees to allow patient to don/doff undergarments without difficulty by 09/06/15.   Time 8   Period Weeks   Status New   PT LONG TERM GOAL #4   Title Patient will increase shoulder ER to at least 60 degrees to allow her to wash and  brush hair with greater ease by 09/06/15.   PT LONG TERM GOAL #5   Title Patient will improve shoulder strength to 5/5 for all motions to allow her to lift heavy objects for increased independence with all home tasks by 09/06/15.   Time 8   Period Weeks   Status New   Additional Long Term Goals   Additional Long Term Goals Yes   PT LONG TERM GOAL #6   Title Patient will report a worst pain of 3/10 on VAS in     left shoulder  to improve tolerance with ADLs and reduced symptoms with activities by 09/06/15   Time 8   Period Weeks   Status New               Plan - 09/01/15 0959    Clinical Impression Statement Pt responded well to manual therapy by experiencing less anterior shoulder pain.  Pt presents with posterior shoulder pain/restriction in the teres minor/major/ lat area and would benefit from continued skilled PT services to improve shoulder IR to improve UE functional use.     Pt will benefit from skilled therapeutic intervention in order to improve on the following deficits Decreased knowledge of precautions;Decreased range of motion;Decreased safety awareness;Decreased strength;Hypomobility;Impaired flexibility;Impaired UE functional use;Pain;Postural dysfunction;Improper body mechanics   Rehab Potential Poor   Clinical Impairments Affecting Rehab Potential Negative: shoudler pain >6 months. Postive: motivated to get better, high level of function prior to injury.    PT Frequency 2x / week   PT Duration 8 weeks   PT Treatment/Interventions ADLs/Self Care Home Management;Electrical Stimulation;Cryotherapy;Iontophoresis 80m/ml Dexamethasone;Moist Heat;Ultrasound;Therapeutic activities;Therapeutic exercise;Patient/family education;Manual techniques;Passive range of motion;Dry needling;Energy conservation   PT Next Visit Plan Shoulder strengthening/ROM, Manual therapy    Consulted and Agree with Plan of Care Patient        Problem List Patient Active Problem List    Diagnosis Date Noted  . Enlarged uterus 04/07/2015  . Intramural leiomyoma of uterus 04/07/2015  . 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 Arthralgia of shoulder 04/14/2014  . Clinical depression 04/14/2014  . HLD (hyperlipidemia) 04/14/2014  . Adult hypothyroidism 04/14/2014   JRenford Dills SPT This entire session was performed under direct supervision and direction of a licensed therapist/therapist assistant . I have personally read, edited and approve of the note as written.  Tortorici,Ashley 09/01/2015, 12:05 PM  [place] [facility] [address] [address]Phone: [phone]  Fax:  [phone] Name: Sharon ACHENBACHMRN: [id number] of  Birth: 23-Nov-1960

## 2015-09-05 ENCOUNTER — Ambulatory Visit: Payer: BLUE CROSS/BLUE SHIELD

## 2015-09-05 DIAGNOSIS — M25512 Pain in left shoulder: Secondary | ICD-10-CM

## 2015-09-05 DIAGNOSIS — R29898 Other symptoms and signs involving the musculoskeletal system: Secondary | ICD-10-CM

## 2015-09-05 DIAGNOSIS — M25612 Stiffness of left shoulder, not elsewhere classified: Secondary | ICD-10-CM

## 2015-09-06 NOTE — Therapy (Signed)
[place] [facility] MAIN [facility] SERVICES [address] [place], [place], [address] Phone: [phone]   Fax:  [phone]  Physical Therapy Treatment  Patient Details  Name: Sharon Hicks MRN: [id number] Date of Birth: 03-11-1961 No Data Recorded  Encounter Date: 09/05/2015      PT End of Session - 09/06/15 1711    Visit Number 13   Number of Visits 17   Date for PT Re-Evaluation 09/06/15   PT Start Time 1730   PT Stop Time 1825   PT Time Calculation (min) 55 min   Activity Tolerance Patient tolerated treatment well   Behavior During Therapy [facility] for tasks assessed/performed      Past Medical History  Diagnosis Date  . Fibroids   . Hypothyroidism   . Elevated lipids   . Abnormal Pap smear     h/o ascus-h pap, colpo negative  . Anemia     h/o  . Anxiety     stressors with fahter aging  . Obesity   . Hearing loss in left ear     Past Surgical History  Procedure Laterality Date  . Excision of thyroid nodule  2/00  . Cholecystectomy, laparoscopic  4/08  . Appendectomy  4/08  . Cardiac evaluation  4/07    stress test, echo, EKG-mild MVP  . Eye surgery Right 11/2014    There were no vitals filed for this visit.  Visit Diagnosis:  Left shoulder pain  Left arm weakness  Decreased ROM of left shoulder      Subjective Assessment - 09/06/15 1710    Subjective pt reports she is no longer having anterior shoulder pain like she was and her IR is improving but still limited. she is functional other wise.    Pertinent History No surgeries to the shoulder or history Cancer. Patient also has a history of hypothyroidism and anxiety.    Limitations Lifting;House hold activities   How long can you sit comfortably? not a problem    How long can you stand comfortably? not a problem    How long can you walk comfortably? as long as needed   Diagnostic tests MRI shows tear in the L Supraspinatus muscles    Patient Stated Goals be able to snap  bra, be able to sleep on the L side and lift objects into abduction with the L UE .    Currently in Pain? No/denies        Manual therapy: PA, inferior and lateral GH glides grade 3-4 3 bouts of 20s  Mobilization with movement into shoulder ER and IR 2x10 Mobilization with movement in sitting into shoudler flexion with inferior posterior GH glide with mob belt 3x10 Pt had improved IR following manual therapy.   Therex: Matrix cable column D 2 shoulder flexion /extension 12.5lbs 2x10 each Lat pull down with cable column 17.5lbs 2x10 In fitness centerL: Rows 2 plates 6N62 Lat pulls 3 plates 9B28 Bicep curls 2x10 20lbs total  Triceps 2x10 10lbs Chest incline press 20lbs 2x10 Instructed pt on UE lifting with free weights including overhead press- lateral raises if she wishes to return to them in the future Extensive education about posture, lifting mechanics, proper breathing technique and avoiding harmful shoulder positions such as lateral raises over 90 deg flexion/abduction Pt requires min verbal and tactile cues for proper exercise performance , some substitution patterns still noted with the shoulder especially with abduction  Ionto:  Skin prep with ETOH swab. ionto STAT patch applied to  L biceps tendon               With 4mg /ml Dexamethasone. Pt instructed on wear time, potential skin irritations and precautions                                  PT Long Term Goals - 07/13/15 0746    PT LONG TERM GOAL #1   Title Patient will be independent with HEP to improve shoulder ROM and Strength to allow improved function with home activities by 09/06/15.   Time 8   Period Weeks   Status New   PT LONG TERM GOAL #2   Title Patient will improve score on the Quick DASH to <10 to indicate improved function with ADLs and return to PLOF by 09/06/15.   Time 8   Period Weeks   Status New   PT LONG TERM GOAL #3   Title Patient will improve Shoulder IR to at least  70 Degrees to allow patient to don/doff undergarments without difficulty by 09/06/15.   Time 8   Period Weeks   Status New   PT LONG TERM GOAL #4   Title Patient will increase shoulder ER to at least 60 degrees to allow her to wash and brush hair with greater ease by 09/06/15.   PT LONG TERM GOAL #5   Title Patient will improve shoulder strength to 5/5 for all motions to allow her to lift heavy objects for increased independence with all home tasks by 09/06/15.   Time 8   Period Weeks   Status New   Additional Long Term Goals   Additional Long Term Goals Yes   PT LONG TERM GOAL #6   Title Patient will report a worst pain of 3/10 on VAS in     left shoulder  to improve tolerance with ADLs and reduced symptoms with activities by 09/06/15   Time 8   Period Weeks   Status New               Plan - 09/06/15 1711    Clinical Impression Statement pt has progressed towards her goals and has gradually had a reduction in pain. her only limitation at this time is pain with IR and inablility to unhook her bra/wash her back. she was able to reach further after manual therapy today. will assess next visit for carry over. progressed therex to include potential activities / strengthening at the gym today  to prepare for DC.    Pt will benefit from skilled therapeutic intervention in order to improve on the following deficits Decreased knowledge of precautions;Decreased range of motion;Decreased safety awareness;Decreased strength;Hypomobility;Impaired flexibility;Impaired UE functional use;Pain;Postural dysfunction;Improper body mechanics   Rehab Potential Poor   Clinical Impairments Affecting Rehab Potential Negative: shoudler pain >6 months. Postive: motivated to get better, high level of function prior to injury.    PT Frequency 2x / week   PT Duration 8 weeks   PT Treatment/Interventions ADLs/Self Care Home Management;Electrical Stimulation;Cryotherapy;Iontophoresis 4mg /ml Dexamethasone;Moist  Heat;Ultrasound;Therapeutic activities;Therapeutic exercise;Patient/family education;Manual techniques;Passive range of motion;Dry needling;Energy conservation   PT Next Visit Plan Shoulder strengthening/ROM, Manual therapy    Consulted and Agree with Plan of Care Patient        Problem List Patient Active Problem List   Diagnosis Date Noted  . Enlarged uterus 04/07/2015  . Intramural leiomyoma of uterus 04/07/2015  . 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 Arthralgia of shoulder 04/14/2014  .  Clinical depression 04/14/2014  . HLD (hyperlipidemia) 04/14/2014  . Adult hypothyroidism 04/14/2014  Carlyon Shadow. Naylene Foell, PT, DPT [phone]   Traeson Dusza 09/06/2015, 5:16 PM  [place] [facility] MAIN [facility] SERVICES [address] [place], [place], [address] Phone: [phone]   Fax:  [phone]  Name: Sharon Hicks MRN: [id number] Date of Birth: 10-Jan-1961

## 2015-09-07 ENCOUNTER — Ambulatory Visit: Payer: BLUE CROSS/BLUE SHIELD

## 2015-09-07 DIAGNOSIS — M25512 Pain in left shoulder: Secondary | ICD-10-CM | POA: Diagnosis not present

## 2015-09-07 DIAGNOSIS — M25612 Stiffness of left shoulder, not elsewhere classified: Secondary | ICD-10-CM

## 2015-09-07 DIAGNOSIS — R29898 Other symptoms and signs involving the musculoskeletal system: Secondary | ICD-10-CM

## 2015-09-08 NOTE — Therapy (Signed)
[place] MAIN [facility] SERVICES [address] [place], [place], [address] Phone: [phone]   Fax:  [phone]  Physical Therapy Treatment/Discharge Note 8/31/-09/07/15  Patient Details  Name: Sharon Hicks MRN: [id number] Date of Birth: [birth year] No Data Recorded  Encounter Date: 09/07/2015      PT End of Session - 09/08/15 5366    Visit Number 14   Number of Visits 17   Date for PT Re-Evaluation 09/06/15   PT Start Time 1735   PT Stop Time 1820   PT Time Calculation (min) 45 min   Activity Tolerance Patient tolerated treatment well   Behavior During Therapy [facility] for tasks assessed/performed      Past Medical History  Diagnosis Date  . Fibroids   . Hypothyroidism   . Elevated lipids   . Abnormal Pap smear     h/o ascus-h pap, colpo negative  . Anemia     h/o  . Anxiety     stressors with fahter aging  . Obesity   . Hearing loss in left ear     Past Surgical History  Procedure Laterality Date  . Excision of thyroid nodule  2/00  . Cholecystectomy, laparoscopic  4/08  . Appendectomy  4/08  . Cardiac evaluation  4/07    stress test, echo, EKG-mild MVP  . Eye surgery Right 11/2014    There were no vitals filed for this visit.  Visit Diagnosis:  Left shoulder pain  Left arm weakness  Decreased ROM of left shoulder      Subjective Assessment - 09/08/15 0934    Subjective Pt relates she is doing well and has not had left shoulder pain with household and work activities.  She has left shoulder pain with internal rotation such as reaching behind her back but is otherwise functional.     Pertinent History No surgeries to the shoulder or history Cancer. Patient also has a history of hypothyroidism and anxiety.    Limitations Lifting;House hold activities   How long can you sit comfortably? not a problem    How long can you stand comfortably? not a problem    How long can you walk comfortably? 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    Currently in Pain? No/denies   Pain Score 0-No pain      Therex: Quick dash: 15% Reassessed strength and ROM to assess progress towards goals Functional ER: able to reach behind head without pain Functional IR: to PSIS with pain in posterior aspect of shoulder Flexion and abduction: WNL ER in supine: 30 degrees IR in supine: 70 degrees Left shoulder MMT: 5/5 in all planes Left shoulder D1 with red band x10 Standing serratus punches with red band x10 In fitness centerL: Rows 2 plates 2x10 Lat pulls 3 plates 2x10 Chest incline press 20lbs 2x10 Instructed pt on UE lifting with free weights including overhead press with palms facing in, lateral raises with elbows bent and to perform exercises less than 90 degree of abduction/flexion  Pt requires min verbal and tactile cues for proper exercise technique  Ionto:   Skin prep with ETOH swab. ionto STAT patch applied to     L biceps tendon  With 14m/ml Dexamethasone. Pt instructed on wear time, potential skin irritations and precautions                            PT Education - 09/08/15 0936    Education provided Yes   Education Details plan of care, reviwed HEP for correct exercise technique, new HEP exercises with weight bearing activties with UEs   Person(s) Educated Patient   Methods Explanation   Comprehension Verbalized understanding             PT Long Term Goals - 09/08/15 06256   PT LONG TERM GOAL #1   Title Patient will be independent with HEP to improve shoulder ROM and Strength to allow improved function with home activities by 09/06/15.   Baseline pt is independent with HEP and is able to perform all her home activities    Time 8   Period Weeks   Status Achieved   PT LONG TERM GOAL #2    Title Patient will improve score on the Quick DASH to <10 to indicate improved function with ADLs and return to PLOF by 09/06/15.   Baseline on 10/26 pt scored 15%   Time 8   Period Weeks   Status Partially Met   PT LONG TERM GOAL #3   Title Patient will improve Shoulder IR to at least 70 Degrees to allow patient to don/doff undergarments without difficulty by 09/06/15.   Baseline pt has difficulty donning/doffing undergarments   Time 8   Period Weeks   Status New   PT LONG TERM GOAL #4   Title Patient will increase shoulder ER to at least 60 degrees to allow her to wash and brush hair with greater ease by 09/06/15.   Baseline pt is able to was her hair and brush hair with ease   Status Achieved   PT LONG TERM GOAL #5   Title Patient will improve shoulder strength to 5/5 for all motions to allow her to lift heavy objects for increased independence with all home tasks by 09/06/15.   Baseline pt's strength is now 5/5 with all motions and is able to lift objects for home tasks    Time 8   Period Weeks   Status Achieved   PT LONG TERM GOAL #6   Title Patient will report a worst pain of 3/10 on VAS in     left shoulder  to improve tolerance with ADLs and reduced symptoms with activities by 09/06/15   Baseline pt's worst pain with ADLs is 6 with washing back but otherwise 0/10 with all other ADLs   Time 8   Period Weeks   Status Partially Met               Plan - 09/08/15 0955    Clinical Impression Statement pt has made improvements since the start of PT and has met and partially met all her goals for PT. Her main deficit is decreased left shoulder internal rotation but is slowly improving.  Based on her history she is functional at home and work without pain.  Pt will be discharged with a HEP to maintain and improve gains made in PT.  Pt was instructed on proper use of equipment in the gym and free weights when she returns to the gym.     Pt will benefit from skilled therapeutic  intervention in order to improve on the following deficits Decreased knowledge of precautions;Decreased range of  motion;Decreased safety awareness;Decreased strength;Hypomobility;Impaired flexibility;Impaired UE functional use;Pain;Postural dysfunction;Improper body mechanics   Rehab Potential Poor   Clinical Impairments Affecting Rehab Potential Negative: shoudler pain >6 months. Postive: motivated to get better, high level of function prior to injury.    PT Frequency 2x / week   PT Duration 8 weeks   PT Treatment/Interventions ADLs/Self Care Home Management;Electrical Stimulation;Cryotherapy;Iontophoresis 105m/ml Dexamethasone;Moist Heat;Ultrasound;Therapeutic activities;Therapeutic exercise;Patient/family education;Manual techniques;Passive range of motion;Dry needling;Energy conservation   PT Next Visit Plan Shoulder strengthening/ROM, Manual therapy    Consulted and Agree with Plan of Care Patient        Problem List Patient Active Problem List   Diagnosis Date Noted  . Enlarged uterus 04/07/2015  . Intramural leiomyoma of uterus 04/07/2015  . 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 Arthralgia of shoulder 04/14/2014  . Clinical depression 04/14/2014  . HLD (hyperlipidemia) 04/14/2014  . Adult hypothyroidism 04/14/2014   JSuezanne Cheshire SPT This entire session was performed under direct supervision and direction of a licensed therapist/therapist assistant . I have personally read, edited and approve of the note as written. AGorden Harms Tortorici, PT, DPT #[id number]  Tortorici,Ashley 09/08/2015, 1:47 PM  [place]MAIN [facility] Health ServicesSERVICES [address] [address]Phone: [phone]  Fax:  [phone] Name: SQUADASIA NEWSHAMMRN: [id number] of Birth: 8January 14, 1962

## 2015-12-12 ENCOUNTER — Encounter: Payer: Self-pay | Admitting: Obstetrics & Gynecology

## 2015-12-12 ENCOUNTER — Ambulatory Visit (INDEPENDENT_AMBULATORY_CARE_PROVIDER_SITE_OTHER): Payer: BLUE CROSS/BLUE SHIELD | Admitting: Obstetrics & Gynecology

## 2015-12-12 VITALS — BP 102/70 | HR 60 | Resp 14 | Ht 66.0 in | Wt 210.0 lb

## 2015-12-12 DIAGNOSIS — Z0000 Encounter for general adult medical examination without abnormal findings: Secondary | ICD-10-CM | POA: Diagnosis not present

## 2015-12-12 DIAGNOSIS — Z124 Encounter for screening for malignant neoplasm of cervix: Secondary | ICD-10-CM

## 2015-12-12 DIAGNOSIS — Z01419 Encounter for gynecological examination (general) (routine) without abnormal findings: Secondary | ICD-10-CM | POA: Diagnosis not present

## 2015-12-12 LAB — POCT URINALYSIS DIPSTICK
Bilirubin, UA: NEGATIVE
GLUCOSE UA: NEGATIVE
Ketones, UA: NEGATIVE
Leukocytes, UA: NEGATIVE
Nitrite, UA: NEGATIVE
PROTEIN UA: NEGATIVE
RBC UA: NEGATIVE
UROBILINOGEN UA: NEGATIVE
pH, UA: 6

## 2015-12-12 MED ORDER — ESZOPICLONE 3 MG PO TABS: 3.0000 mg | ORAL_TABLET | Freq: Every day | ORAL | Status: DC

## 2015-12-12 NOTE — Progress Notes (Addendum)
55 y.o. G0P0000 SingleCaucasianF here for annual exam.  Pt is doing well and coping well after the death of her father.  Also, reports she has a really good friend who died with recurrent breast cancer after 16 years from initial diagnosis.  Denies vaginal bleeding.    Had a rotator cuff partial tear.  Did PT and this helped.  Now she has plantar fasciitis.    Pt continues to contemplate hysterctomy.  Although her uterus is smaller with ultrasound done in June, 2016, she continues to have pelvic and bladder pressure.  This has improved over the last year but doesn't seem to have changed much in several months for the pt.  She knows I cannot guarantee these would improve but she feel it will.  Has work commitments until at least April, so will let me know what she is thinking as that time gets closer.  PCP: Dr. Doy Hutching.  Labs were sent today from this office  Patient's last menstrual period was 10/12/2014.          Sexually active: No.  The current method of family planning is post menopausal status.    Exercising: No.  The patient does not participate in regular exercise at present. Smoker:  no  Health Maintenance: Pap:  10/06/14 ASCUS. HR HPV:neg  History of abnormal Pap:  yes MMG: 03/02/15 BIRADS1:neg Colonoscopy:  2011 Normal - f/u 10 years  BMD:   ~2011  TDaP:  2012 Screening Labs: PCP, Hb today: PCP, Urine today: negative    reports that she has never smoked. She has never used smokeless tobacco. She reports that she drinks about 0.6 - 1.2 oz of alcohol per week. She reports that she does not use illicit drugs.  Past Medical History  Diagnosis Date  . Fibroids   . Hypothyroidism   . Elevated lipids   . Abnormal Pap smear     h/o ascus-h pap, colpo negative  . Anemia     h/o  . Anxiety     stressors with fahter aging  . Obesity   . Hearing loss in left ear     Past Surgical History  Procedure Laterality Date  . Excision of thyroid nodule  2/00  . Cholecystectomy,  laparoscopic  4/08  . Appendectomy  4/08  . Cardiac evaluation  4/07    stress test, echo, EKG-mild MVP  . Eye surgery Right 11/2014    Current Outpatient Prescriptions  Medication Sig Dispense Refill  . ALPRAZolam (XANAX) 0.5 MG tablet as needed.  2  . Biotin 10 MG CAPS Take by mouth daily.    . Calcium Carbonate (CALCIUM 600 PO) Take by mouth daily.    . Cholecalciferol (D-5000) 5000 UNITS TABS Take by mouth daily.    . Cyanocobalamin (RA VITAMIN B-12 TR) 1000 MCG TBCR Take by mouth daily.    . Eszopiclone 3 MG TABS Take 1 tablet by mouth daily.  3  . FLUoxetine (PROZAC) 40 MG capsule Take 40 mg by mouth daily.  2  . levothyroxine (SYNTHROID, LEVOTHROID) 125 MCG tablet Take 1 tablet by mouth daily.  11  . Multiple Vitamin (MULTIVITAMIN) tablet Take 1 tablet by mouth daily.    . Omega-3 Fatty Acids (FISH OIL PO) Take by mouth.     No current facility-administered medications for this visit.    Family History  Problem Relation Age of Onset  . Alzheimer's disease Mother 68    died  . CVA Mother   . Heart attack Father   .  Kidney cancer Father   . Hypertension Father   . Hyperlipidemia Father   . Cancer Father     reoccurred 03/19/13/mets to lungs, adrenal gland, and some lymph nodes    ROS:  Pertinent items are noted in HPI.  Otherwise, a comprehensive ROS was negative.  Exam:   BP 102/70 mmHg  Pulse 60  Resp 14  Ht 5\' 6"  (1.676 m)  Wt 210 lb (95.255 kg)  BMI 33.91 kg/m2  LMP 10/12/2014  Weight change: #3  Height: 5\' 6"  (167.6 cm)  Ht Readings from Last 3 Encounters:  12/12/15 5\' 6"  (1.676 m)  04/01/15 5\' 6"  (1.676 m)  02/14/15 5\' 6"  (1.676 m)    General appearance: alert, cooperative and appears stated age Head: Normocephalic, without obvious abnormality, atraumatic Neck: no adenopathy, supple, symmetrical, trachea midline and thyroid normal to inspection and palpation Lungs: clear to auscultation bilaterally Breasts: normal appearance, no masses or  tenderness Heart: regular rate and rhythm Abdomen: soft, non-tender; bowel sounds normal; no masses,  no organomegaly Extremities: extremities normal, atraumatic, no cyanosis or edema Skin: Skin color, texture, turgor normal. No rashes or lesions Lymph nodes: Cervical, supraclavicular, and axillary nodes normal. No abnormal inguinal nodes palpated Neurologic: Grossly normal   Pelvic: External genitalia:  no lesions              Urethra:  normal appearing urethra with no masses, tenderness or lesions              Bartholins and Skenes: normal                 Vagina: normal appearing vagina with normal color and discharge, no lesions              Cervix: no lesions              Pap taken: Yes.   Bimanual Exam:  Uterus:  enlarged, 8-10 weeks size, globular and consistent with fibroids              Adnexa: normal adnexa               Rectovaginal: Confirms               Anus:  normal sphincter tone, no lesions  Chaperone was present for exam.  A:  Well Woman with normal exam H/o ASCUS-H 6/12, neg pap with neg HR HPV 7/14 H/O fibroids.  Despite decreased size on PUS 6/16, she continues to still feel symptoms Elevated lipids  P: Mammogram yearly Pap obtained today Labs with PCP Pt will call if decides to proceed with hysterectomy for scheduling return annually or prn

## 2015-12-16 LAB — IPS PAP TEST WITH REFLEX TO HPV

## 2015-12-22 NOTE — Addendum Note (Signed)
Addended by: Megan Salon on: 12/22/2015 04:35 PM   Modules accepted: Miquel Dunn

## 2016-02-21 ENCOUNTER — Other Ambulatory Visit: Payer: Self-pay | Admitting: Internal Medicine

## 2016-02-21 DIAGNOSIS — Z1231 Encounter for screening mammogram for malignant neoplasm of breast: Secondary | ICD-10-CM

## 2016-03-02 ENCOUNTER — Ambulatory Visit
Admission: RE | Admit: 2016-03-02 | Discharge: 2016-03-02 | Disposition: A | Discharge status: Home / Self Care | Payer: BLUE CROSS/BLUE SHIELD | Source: Ambulatory Visit | Attending: Internal Medicine | Admitting: Internal Medicine

## 2016-03-02 DIAGNOSIS — Z1231 Encounter for screening mammogram for malignant neoplasm of breast: Secondary | ICD-10-CM | POA: Insufficient documentation

## 2016-04-06 ENCOUNTER — Other Ambulatory Visit: Payer: Self-pay | Admitting: Obstetrics & Gynecology

## 2016-04-06 MED ORDER — FLUCONAZOLE 150 MG PO TABS: ORAL_TABLET | ORAL | Status: DC

## 2016-04-06 NOTE — Telephone Encounter (Signed)
Call to patient and detailed message left. Detailed message left okay per designated party release form. Dr. Sabra Heck sent in prescription for Diflucan 150 mg po today and repeat in 3 days if symptoms continue. Advised to please call office if symptoms continue or worsen for appointment with Dr. Sabra Heck. Advised to call back with any questions or concerns.  Routing to provider for final review. Patient agreeable to disposition. Will close encounter.

## 2016-04-06 NOTE — Telephone Encounter (Signed)
Last annual exam 11-25-15 with Dr Sabra Heck. Call to patient.Left message to call back. Ask for triage nurse.

## 2016-04-06 NOTE — Telephone Encounter (Signed)
Return call from patient.  States started with possible yeast infection at lunch time today. External vaginal itching and burning. Denies vaginal discharge. Similar to previous yeast infections. Denies any new partners or exposures. Denies any other symptoms. States has taken diflucan in past without problem. Allergic to Codeine. Lives 30 mins away and is traveling this weekend so cannot come in. Requests Rx called in. Advised will review with MD for RX Diflucan. If symptoms not resolved, will need office visit.  Can use hydrocortisone ointment externally for itching burning. Will call back if other instructions.

## 2016-04-06 NOTE — Telephone Encounter (Signed)
Patient has a yeast infection and is requesting medication to be sent to her pharmacy. She is aware we do not treat over the phone but would like to speak with the nurse . She states she is going out of town and can not come in for an appointment.

## 2016-12-14 NOTE — Progress Notes (Addendum)
56 y.o. G0P0000 Single Caucasian F here for annual exam.  Doing well.  Has established a better relationship with her brother and this relationship is good for her.  Work is really just too much for her.  She feels the need for improved balance.  She states she "lost" 16 days of vacation last year.    Denies vaginal bleeding.    Patient's last menstrual period was 10/17/2014.          Sexually active: No.  The current method of family planning is post menopausal status.    Exercising: No.  The patient does not participate in regular exercise at present. Smoker:  no  Health Maintenance: Pap:  12/12/15 ASCUS, HR HPV negative, 10/06/14 ASCUS, HR HPV negative  History of abnormal Pap:  yes MMG:  03/02/16 BIRADS 1 negative  Colonoscopy:  2011- normal- repeat 10 years  BMD:   Years ago with PCP TDaP:  2012  Pneumonia vaccine(s):  never Zostavax:   PCP Hep C testing: defer to PCP- has blood work in 1 month Screening Labs: PCP, Hb today: PCP, Urine today: normal    reports that she has never smoked. She has never used smokeless tobacco. She reports that she drinks about 0.6 - 1.2 oz of alcohol per week . She reports that she does not use drugs.  Past Medical History:  Diagnosis Date  . Abnormal Pap smear    h/o ascus-h pap, colpo negative  . Anemia    h/o  . Anxiety    stressors with fahter aging  . Elevated lipids   . Fibroids   . Hearing loss in left ear   . Hypothyroidism   . Obesity     Past Surgical History:  Procedure Laterality Date  . APPENDECTOMY  4/08  . cardiac evaluation  4/07   stress test, echo, EKG-mild MVP  . CHOLECYSTECTOMY, LAPAROSCOPIC  4/08  . excision of thyroid nodule  2/00  . EYE SURGERY Right 11/2014    Current Outpatient Prescriptions  Medication Sig Dispense Refill  . ALPRAZolam (XANAX) 0.5 MG tablet as needed.  2  . Biotin 10 MG CAPS Take by mouth daily.    . Calcium Carbonate (CALCIUM 600 PO) Take by mouth daily.    . Cholecalciferol (D-5000)  5000 UNITS TABS Take by mouth daily.    . Cyanocobalamin (RA VITAMIN B-12 TR) 1000 MCG TBCR Take by mouth daily.    . Eszopiclone 3 MG TABS Take 1 tablet (3 mg total) by mouth daily. 30 tablet 3  . fluconazole (DIFLUCAN) 150 MG tablet Take one tablet now and repeat in 3 days if needed. 2 tablet 0  . FLUoxetine (PROZAC) 40 MG capsule Take 40 mg by mouth daily.  2  . levothyroxine (SYNTHROID, LEVOTHROID) 125 MCG tablet Take 1 tablet by mouth daily.  11  . Multiple Vitamin (MULTIVITAMIN) tablet Take 1 tablet by mouth daily.    . Omega-3 Fatty Acids (FISH OIL PO) Take by mouth.     No current facility-administered medications for this visit.     Family History  Problem Relation Age of Onset  . Alzheimer's disease Mother 46    died  . CVA Mother   . Heart attack Father   . Kidney cancer Father   . Hypertension Father   . Hyperlipidemia Father   . Cancer Father     reoccurred 03/19/13/mets to lungs, adrenal gland, and some lymph nodes    ROS:  Pertinent items are noted in HPI.  Otherwise, a comprehensive ROS was negative.  Exam:   BP 118/74 (BP Location: Right Arm, Patient Position: Sitting, Cuff Size: Normal)   Pulse 66   Resp 14   Ht 5' 5.75" (1.67 m)   Wt 213 lb 9.6 oz (96.9 kg)   LMP 10/17/2014   BMI 34.74 kg/m   Weight change: +3#   Height: 5' 5.75" (167 cm)  Ht Readings from Last 3 Encounters:  12/17/16 5' 5.75" (1.67 m)  12/12/15 5\' 6"  (1.676 m)  04/01/15 5\' 6"  (1.676 m)    General appearance: alert, cooperative and appears stated age Head: Normocephalic, without obvious abnormality, atraumatic Neck: no adenopathy, supple, symmetrical, trachea midline and thyroid normal to inspection and palpation Lungs: clear to auscultation bilaterally Breasts: normal appearance, no masses or tenderness Heart: regular rate and rhythm Abdomen: soft, non-tender; bowel sounds normal; no masses,  no organomegaly Extremities: extremities normal, atraumatic, no cyanosis or edema Skin:  Skin color, texture, turgor normal. No rashes or lesions Lymph nodes: Cervical, supraclavicular, and axillary nodes normal. No abnormal inguinal nodes palpated Neurologic: Grossly normal  Pelvic: External genitalia:  no lesions              Urethra:  normal appearing urethra with no masses, tenderness or lesions              Bartholins and Skenes: normal                 Vagina: normal appearing vagina with normal color and discharge, no lesions              Cervix: no lesions              Pap taken: Yes.   Bimanual Exam:  Uterus:  10-12 weeks, mobile, nodular c/w fibroids              Adnexa: normal adnexa and no mass, fullness, tenderness               Rectovaginal: Confirms               Anus:  normal sphincter tone, no lesions  Chaperone was present for exam.  A:  Well Woman with normal exam PMP, no HRT H/O ASCUS-H pap 6/12, neg pap with neg HR HPV 7/14, ASCUS pap neg HR HPV 11/15, ASCUS with neg HR HPV 12/12/15 Fibroid uterus H/O elevated lipids Hot flashes  P:   Mammogram yearly pap smear and HR HPV Has lab work planned next month.  D/W pt doing a 3D MMG. D/W pt neurontin for hot flashes Return annually or prn

## 2016-12-17 ENCOUNTER — Encounter: Payer: Self-pay | Admitting: Obstetrics & Gynecology

## 2016-12-17 ENCOUNTER — Ambulatory Visit (INDEPENDENT_AMBULATORY_CARE_PROVIDER_SITE_OTHER): Payer: BLUE CROSS/BLUE SHIELD | Admitting: Obstetrics & Gynecology

## 2016-12-17 VITALS — BP 118/74 | HR 66 | Resp 14 | Ht 65.75 in | Wt 213.6 lb

## 2016-12-17 DIAGNOSIS — Z01419 Encounter for gynecological examination (general) (routine) without abnormal findings: Secondary | ICD-10-CM

## 2016-12-17 DIAGNOSIS — R87610 Atypical squamous cells of undetermined significance on cytologic smear of cervix (ASC-US): Secondary | ICD-10-CM | POA: Diagnosis not present

## 2016-12-17 DIAGNOSIS — Z124 Encounter for screening for malignant neoplasm of cervix: Secondary | ICD-10-CM

## 2016-12-17 DIAGNOSIS — Z0000 Encounter for general adult medical examination without abnormal findings: Secondary | ICD-10-CM | POA: Diagnosis not present

## 2016-12-17 LAB — POCT URINALYSIS DIPSTICK
BILIRUBIN UA: NEGATIVE
GLUCOSE UA: NEGATIVE
KETONES UA: NEGATIVE
Leukocytes, UA: NEGATIVE
Nitrite, UA: NEGATIVE
PH UA: 5
Protein, UA: NEGATIVE
RBC UA: NEGATIVE
Urobilinogen, UA: NEGATIVE

## 2016-12-17 NOTE — Patient Instructions (Signed)
Have a Hepatitis C done with your next blood work.

## 2016-12-20 LAB — IPS PAP TEST WITH HPV

## 2016-12-24 DIAGNOSIS — F331 Major depressive disorder, recurrent, moderate: Secondary | ICD-10-CM | POA: Diagnosis not present

## 2017-01-07 DIAGNOSIS — F331 Major depressive disorder, recurrent, moderate: Secondary | ICD-10-CM | POA: Diagnosis not present

## 2017-02-01 DIAGNOSIS — F5105 Insomnia due to other mental disorder: Secondary | ICD-10-CM | POA: Diagnosis not present

## 2017-02-01 DIAGNOSIS — F331 Major depressive disorder, recurrent, moderate: Secondary | ICD-10-CM | POA: Diagnosis not present

## 2017-02-04 DIAGNOSIS — F331 Major depressive disorder, recurrent, moderate: Secondary | ICD-10-CM | POA: Diagnosis not present

## 2017-02-12 ENCOUNTER — Other Ambulatory Visit: Payer: Self-pay | Admitting: Family Medicine

## 2017-02-12 DIAGNOSIS — Z1231 Encounter for screening mammogram for malignant neoplasm of breast: Secondary | ICD-10-CM

## 2017-02-25 DIAGNOSIS — F331 Major depressive disorder, recurrent, moderate: Secondary | ICD-10-CM | POA: Diagnosis not present

## 2017-03-06 ENCOUNTER — Ambulatory Visit
Admission: RE | Admit: 2017-03-06 | Discharge: 2017-03-06 | Disposition: A | Discharge status: Home / Self Care | Payer: BLUE CROSS/BLUE SHIELD | Source: Ambulatory Visit | Attending: Family Medicine | Admitting: Family Medicine

## 2017-03-06 DIAGNOSIS — Z1231 Encounter for screening mammogram for malignant neoplasm of breast: Secondary | ICD-10-CM | POA: Insufficient documentation

## 2017-03-11 DIAGNOSIS — F331 Major depressive disorder, recurrent, moderate: Secondary | ICD-10-CM | POA: Diagnosis not present

## 2017-03-13 DIAGNOSIS — E039 Hypothyroidism, unspecified: Secondary | ICD-10-CM | POA: Diagnosis not present

## 2017-03-13 DIAGNOSIS — E7800 Pure hypercholesterolemia, unspecified: Secondary | ICD-10-CM | POA: Diagnosis not present

## 2017-03-13 DIAGNOSIS — Z13820 Encounter for screening for osteoporosis: Secondary | ICD-10-CM | POA: Diagnosis not present

## 2017-03-13 DIAGNOSIS — R2681 Unsteadiness on feet: Secondary | ICD-10-CM | POA: Diagnosis not present

## 2017-03-21 DIAGNOSIS — M8588 Other specified disorders of bone density and structure, other site: Secondary | ICD-10-CM | POA: Diagnosis not present

## 2017-03-22 ENCOUNTER — Ambulatory Visit: Payer: BLUE CROSS/BLUE SHIELD | Admitting: Obstetrics & Gynecology

## 2017-05-06 DIAGNOSIS — F331 Major depressive disorder, recurrent, moderate: Secondary | ICD-10-CM | POA: Diagnosis not present

## 2017-05-10 DIAGNOSIS — R200 Anesthesia of skin: Secondary | ICD-10-CM | POA: Diagnosis not present

## 2017-05-10 DIAGNOSIS — R202 Paresthesia of skin: Secondary | ICD-10-CM | POA: Diagnosis not present

## 2017-05-10 DIAGNOSIS — R2689 Other abnormalities of gait and mobility: Secondary | ICD-10-CM | POA: Diagnosis not present

## 2017-05-10 DIAGNOSIS — R413 Other amnesia: Secondary | ICD-10-CM | POA: Diagnosis not present

## 2017-05-16 ENCOUNTER — Other Ambulatory Visit: Payer: Self-pay | Admitting: Neurology

## 2017-05-16 DIAGNOSIS — R413 Other amnesia: Secondary | ICD-10-CM

## 2017-05-16 DIAGNOSIS — R2689 Other abnormalities of gait and mobility: Secondary | ICD-10-CM

## 2017-05-22 ENCOUNTER — Ambulatory Visit
Admission: RE | Admit: 2017-05-22 | Discharge: 2017-05-22 | Disposition: A | Discharge status: Home / Self Care | Payer: BLUE CROSS/BLUE SHIELD | Source: Ambulatory Visit | Attending: Neurology | Admitting: Neurology

## 2017-05-22 DIAGNOSIS — R413 Other amnesia: Secondary | ICD-10-CM | POA: Diagnosis not present

## 2017-05-22 DIAGNOSIS — R2689 Other abnormalities of gait and mobility: Secondary | ICD-10-CM | POA: Diagnosis not present

## 2017-05-22 MED ORDER — GADOBENATE DIMEGLUMINE 529 MG/ML IV SOLN
20.0000 mL | Freq: Once | INTRAVENOUS | Status: AC | PRN
  Administered 2017-05-22: 20 mL via INTRAVENOUS

## 2017-05-28 DIAGNOSIS — F5105 Insomnia due to other mental disorder: Secondary | ICD-10-CM | POA: Diagnosis not present

## 2017-05-28 DIAGNOSIS — F331 Major depressive disorder, recurrent, moderate: Secondary | ICD-10-CM | POA: Diagnosis not present

## 2017-06-13 DIAGNOSIS — E039 Hypothyroidism, unspecified: Secondary | ICD-10-CM | POA: Diagnosis not present

## 2017-06-13 DIAGNOSIS — E7800 Pure hypercholesterolemia, unspecified: Secondary | ICD-10-CM | POA: Diagnosis not present

## 2017-06-13 DIAGNOSIS — Z79899 Other long term (current) drug therapy: Secondary | ICD-10-CM | POA: Diagnosis not present

## 2017-06-24 DIAGNOSIS — F331 Major depressive disorder, recurrent, moderate: Secondary | ICD-10-CM | POA: Diagnosis not present

## 2017-06-26 DIAGNOSIS — R202 Paresthesia of skin: Secondary | ICD-10-CM | POA: Diagnosis not present

## 2017-06-26 DIAGNOSIS — R200 Anesthesia of skin: Secondary | ICD-10-CM | POA: Diagnosis not present

## 2017-07-22 DIAGNOSIS — F331 Major depressive disorder, recurrent, moderate: Secondary | ICD-10-CM | POA: Diagnosis not present

## 2017-08-05 DIAGNOSIS — F331 Major depressive disorder, recurrent, moderate: Secondary | ICD-10-CM | POA: Diagnosis not present

## 2017-08-05 DIAGNOSIS — R200 Anesthesia of skin: Secondary | ICD-10-CM | POA: Diagnosis not present

## 2017-08-12 DIAGNOSIS — R413 Other amnesia: Secondary | ICD-10-CM | POA: Diagnosis not present

## 2017-08-12 DIAGNOSIS — R202 Paresthesia of skin: Secondary | ICD-10-CM | POA: Diagnosis not present

## 2017-08-12 DIAGNOSIS — R2689 Other abnormalities of gait and mobility: Secondary | ICD-10-CM | POA: Diagnosis not present

## 2017-08-12 DIAGNOSIS — R200 Anesthesia of skin: Secondary | ICD-10-CM | POA: Diagnosis not present

## 2017-08-21 DIAGNOSIS — F331 Major depressive disorder, recurrent, moderate: Secondary | ICD-10-CM | POA: Diagnosis not present

## 2017-09-02 DIAGNOSIS — F331 Major depressive disorder, recurrent, moderate: Secondary | ICD-10-CM | POA: Diagnosis not present

## 2017-09-18 DIAGNOSIS — F331 Major depressive disorder, recurrent, moderate: Secondary | ICD-10-CM | POA: Diagnosis not present

## 2017-09-25 DIAGNOSIS — F5105 Insomnia due to other mental disorder: Secondary | ICD-10-CM | POA: Diagnosis not present

## 2017-09-25 DIAGNOSIS — F331 Major depressive disorder, recurrent, moderate: Secondary | ICD-10-CM | POA: Diagnosis not present

## 2017-09-26 DIAGNOSIS — E039 Hypothyroidism, unspecified: Secondary | ICD-10-CM | POA: Diagnosis not present

## 2017-09-26 DIAGNOSIS — R8290 Unspecified abnormal findings in urine: Secondary | ICD-10-CM | POA: Diagnosis not present

## 2017-09-26 DIAGNOSIS — E7800 Pure hypercholesterolemia, unspecified: Secondary | ICD-10-CM | POA: Diagnosis not present

## 2017-10-29 DIAGNOSIS — F331 Major depressive disorder, recurrent, moderate: Secondary | ICD-10-CM | POA: Diagnosis not present

## 2017-12-02 DIAGNOSIS — F331 Major depressive disorder, recurrent, moderate: Secondary | ICD-10-CM | POA: Diagnosis not present

## 2018-01-07 DIAGNOSIS — F331 Major depressive disorder, recurrent, moderate: Secondary | ICD-10-CM | POA: Diagnosis not present

## 2018-01-15 DIAGNOSIS — F5105 Insomnia due to other mental disorder: Secondary | ICD-10-CM | POA: Diagnosis not present

## 2018-01-15 DIAGNOSIS — F331 Major depressive disorder, recurrent, moderate: Secondary | ICD-10-CM | POA: Diagnosis not present

## 2018-01-17 ENCOUNTER — Other Ambulatory Visit (HOSPITAL_COMMUNITY)
Admission: RE | Admit: 2018-01-17 | Discharge: 2018-01-17 | Disposition: A | Discharge status: Home / Self Care | Payer: BLUE CROSS/BLUE SHIELD | Source: Ambulatory Visit | Attending: Obstetrics & Gynecology | Admitting: Obstetrics & Gynecology

## 2018-01-17 ENCOUNTER — Encounter: Payer: Self-pay | Admitting: Obstetrics & Gynecology

## 2018-01-17 ENCOUNTER — Ambulatory Visit: Payer: BLUE CROSS/BLUE SHIELD | Admitting: Obstetrics & Gynecology

## 2018-01-17 ENCOUNTER — Other Ambulatory Visit: Payer: Self-pay

## 2018-01-17 VITALS — BP 122/64 | HR 62 | Resp 14 | Ht 66.0 in | Wt 173.4 lb

## 2018-01-17 DIAGNOSIS — Z01419 Encounter for gynecological examination (general) (routine) without abnormal findings: Secondary | ICD-10-CM | POA: Diagnosis not present

## 2018-01-17 DIAGNOSIS — Z0000 Encounter for general adult medical examination without abnormal findings: Secondary | ICD-10-CM

## 2018-01-17 DIAGNOSIS — Z124 Encounter for screening for malignant neoplasm of cervix: Secondary | ICD-10-CM | POA: Diagnosis not present

## 2018-01-17 LAB — POCT URINALYSIS DIPSTICK
Bilirubin, UA: NEGATIVE
Glucose, UA: NEGATIVE
Ketones, UA: NEGATIVE
LEUKOCYTES UA: NEGATIVE
NITRITE UA: NEGATIVE
PH UA: 5 (ref 5.0–8.0)
PROTEIN UA: NEGATIVE
RBC UA: NEGATIVE
UROBILINOGEN UA: 0.2 U/dL

## 2018-01-17 NOTE — Progress Notes (Signed)
57 y.o. G0P0000 SingleCaucasianF here for annual exam.  Doing well.  Has lost 40 pounds.  Working on weight loss.  Doing weight watchers.  Also exercising as well.    Denies vaginal bleeding.     Hot flashes have improved with weight loss.  Does have continued pelvic pressure.  Still having urinary leakage.  Did do urodynamics 4/16.  TVT recommended.    Patient's last menstrual period was 10/17/2014.          Sexually active: No.  The current method of family planning is post menopausal status.    Exercising: Yes.    walking, yoga Smoker:  no  Health Maintenance: Pap:  12/17/16 neg. HR HPV:neg   12/12/15 ASCUS. HR HPV:neg  History of abnormal Pap:  yes MMG:  03/07/17 BIRADS1:neg  Colonoscopy:  2011 f/u 10 years  BMD:   03/21/17.  Neg.  Reviewed in care everywhere.   TDaP:  2012 Pneumonia vaccine(s):  no Shingrix:   Done at age 53  Hep C testing: 03/08/17 neg Screening Labs: PCP, Hb today: PCP, Urine today: normal    reports that  has never smoked. she has never used smokeless tobacco. She reports that she drinks about 0.6 - 1.2 oz of alcohol per week. She reports that she does not use drugs.  Past Medical History:  Diagnosis Date  . Abnormal Pap smear    h/o ascus-h pap, colpo negative  . Anemia    h/o  . Anxiety    stressors with fahter aging  . Elevated lipids   . Fibroids   . Hearing loss in left ear   . Hypothyroidism   . Obesity     Past Surgical History:  Procedure Laterality Date  . APPENDECTOMY  4/08  . cardiac evaluation  4/07   stress test, echo, EKG-mild MVP  . CHOLECYSTECTOMY, LAPAROSCOPIC  4/08  . excision of thyroid nodule  2/00  . EYE SURGERY Right 11/2014    Current Outpatient Medications  Medication Sig Dispense Refill  . ALPRAZolam (XANAX) 0.5 MG tablet as needed.  2  . Biotin 10 MG CAPS Take by mouth daily.    . Calcium Carbonate (CALCIUM 600 PO) Take by mouth daily.    . Cholecalciferol (D-5000) 5000 UNITS TABS Take by mouth daily.    .  Cyanocobalamin (RA VITAMIN B-12 TR) 1000 MCG TBCR Take by mouth daily.    . Eszopiclone 3 MG TABS Take 1 tablet (3 mg total) by mouth daily. 30 tablet 3  . FLUoxetine (PROZAC) 20 MG capsule     . levothyroxine (SYNTHROID, LEVOTHROID) 175 MCG tablet Take by mouth.    . Multiple Vitamin (MULTIVITAMIN) tablet Take 1 tablet by mouth daily.    . Omega-3 Fatty Acids (FISH OIL PO) Take by mouth.    Marland Kitchen omeprazole (PRILOSEC) 40 MG capsule Take by mouth.    . pravastatin (PRAVACHOL) 20 MG tablet Take by mouth.     No current facility-administered medications for this visit.     Family History  Problem Relation Age of Onset  . Alzheimer's disease Mother 24       died  . CVA Mother   . Heart attack Father   . Kidney cancer Father   . Hypertension Father   . Hyperlipidemia Father   . Cancer Father        reoccurred 03/19/13/mets to lungs, adrenal gland, and some lymph nodes    ROS:  Pertinent items are noted in HPI.  Otherwise, a comprehensive  ROS was negative.  Exam:   BP 122/64 (BP Location: Right Arm, Patient Position: Sitting, Cuff Size: Normal)   Pulse 62   Resp 14   Ht 5\' 6"  (1.676 m)   Wt 173 lb 6.4 oz (78.7 kg)   LMP 10/17/2014   BMI 27.99 kg/m   Weight change: -40#   Height: 5\' 6"  (167.6 cm)  Ht Readings from Last 3 Encounters:  01/17/18 5\' 6"  (1.676 m)  12/17/16 5' 5.75" (1.67 m)  12/12/15 5\' 6"  (1.676 m)    General appearance: alert, cooperative and appears stated age Head: Normocephalic, without obvious abnormality, atraumatic Neck: no adenopathy, supple, symmetrical, trachea midline and thyroid normal to inspection and palpation Lungs: clear to auscultation bilaterally Breasts: normal appearance, no masses or tenderness Heart: regular rate and rhythm Abdomen: soft, non-tender; bowel sounds normal; no masses,  no organomegaly Extremities: extremities normal, atraumatic, no cyanosis or edema Skin: Skin color, texture, turgor normal. No rashes or lesions Lymph nodes:  Cervical, supraclavicular, and axillary nodes normal. No abnormal inguinal nodes palpated Neurologic: Grossly normal   Pelvic: External genitalia:  no lesions              Urethra:  normal appearing urethra with no masses, tenderness or lesions              Bartholins and Skenes: normal                 Vagina: normal appearing vagina with normal color and discharge, no lesions              Cervix: no lesions              Pap taken: Yes.   Bimanual Exam:  Uterus:  enlarged, 10-12 weeks size              Adnexa: normal adnexa               Rectovaginal: Confirms               Anus:  normal sphincter tone, no lesions  Chaperone was present for exam.  A:  Well Woman with normal exam PMP, no HRT H/o ASCUS-H pap 6/12, neg pap with neg HR HPV 7/14, ASCUS pap with neg HR HPV 11/15, ASCUS with neg HR HPV 12/12/15, neg pap with neg HR HPV 2/18 Fibroid uterus Elevated lipids  P:   Mammogram guidelines reviewed.  Doing 3D MMG. pap smear obtained today Lab work and vaccines UTD  Continues to contemplate surgery. return annually or prn

## 2018-01-23 ENCOUNTER — Telehealth: Payer: Self-pay

## 2018-01-23 NOTE — Telephone Encounter (Signed)
-----   Message from Megan Salon, MD sent at 01/23/2018  6:48 AM EDT ----- Please let pt know her pap showed ASCUS findings with neg HR HPV.  Needs pap again in one year.  02 recall is ok.

## 2018-01-23 NOTE — Telephone Encounter (Signed)
Left detailed message at number provided [phone], okay per ROI. Advised of results and recommended return call with any further questions. Encounter closed.

## 2018-01-27 ENCOUNTER — Other Ambulatory Visit: Payer: Self-pay | Admitting: Internal Medicine

## 2018-01-27 ENCOUNTER — Telehealth: Payer: Self-pay

## 2018-01-27 DIAGNOSIS — Z1231 Encounter for screening mammogram for malignant neoplasm of breast: Secondary | ICD-10-CM

## 2018-01-27 NOTE — Telephone Encounter (Signed)
Spoke to Lavonia with [facility] and she states Dr.Miller ordered a pap on patient but cytology ran pap and HR HPV. She wants Dr.Miller to know they will credit patient's account for the HR HPV since she did not order. Routed to Dr. Sabra Heck

## 2018-01-28 LAB — CYTOLOGY - PAP
DIAGNOSIS: UNDETERMINED — AB
HPV (WINDOPATH): NOT DETECTED

## 2018-01-29 DIAGNOSIS — F331 Major depressive disorder, recurrent, moderate: Secondary | ICD-10-CM | POA: Diagnosis not present

## 2018-02-10 DIAGNOSIS — E039 Hypothyroidism, unspecified: Secondary | ICD-10-CM | POA: Diagnosis not present

## 2018-02-10 DIAGNOSIS — Z0000 Encounter for general adult medical examination without abnormal findings: Secondary | ICD-10-CM | POA: Diagnosis not present

## 2018-02-10 DIAGNOSIS — F3341 Major depressive disorder, recurrent, in partial remission: Secondary | ICD-10-CM | POA: Diagnosis not present

## 2018-02-10 DIAGNOSIS — E7800 Pure hypercholesterolemia, unspecified: Secondary | ICD-10-CM | POA: Diagnosis not present

## 2018-03-07 ENCOUNTER — Ambulatory Visit
Admission: RE | Admit: 2018-03-07 | Discharge: 2018-03-07 | Disposition: A | Discharge status: Home / Self Care | Payer: BLUE CROSS/BLUE SHIELD | Source: Ambulatory Visit | Attending: Internal Medicine | Admitting: Internal Medicine

## 2018-03-07 DIAGNOSIS — Z1231 Encounter for screening mammogram for malignant neoplasm of breast: Secondary | ICD-10-CM | POA: Diagnosis not present

## 2018-03-24 DIAGNOSIS — F331 Major depressive disorder, recurrent, moderate: Secondary | ICD-10-CM | POA: Diagnosis not present

## 2018-04-21 DIAGNOSIS — F331 Major depressive disorder, recurrent, moderate: Secondary | ICD-10-CM | POA: Diagnosis not present

## 2018-05-29 DIAGNOSIS — F331 Major depressive disorder, recurrent, moderate: Secondary | ICD-10-CM | POA: Diagnosis not present

## 2018-06-05 ENCOUNTER — Telehealth: Payer: Self-pay | Admitting: Obstetrics & Gynecology

## 2018-06-05 NOTE — Telephone Encounter (Signed)
Patient has discussed hysterectomy with Dr. Sabra Heck at her last few visits and also getting a pessary. She would like to speak with a nurse about starting the process of scheduling for the hysterectomy.

## 2018-06-06 NOTE — Telephone Encounter (Signed)
Routing to S. Yeakley, RN.  

## 2018-06-06 NOTE — Telephone Encounter (Signed)
Return call to patient. Discussed hysterectomy with Dr Sabra Heck at annual 01-17-18. Would like to proceed with surgery planning combined with TVTpreviously discussed with Dr Quincy Simmonds. (Last 02-2015)  Advised will need to have office visit with Dr Quincy Simmonds regarding TVT.  Will confirm surgical plan with Dr Sabra Heck for instructions.   Appointment with Dr Quincy Simmonds scheduled for 06-13-18

## 2018-06-07 NOTE — Telephone Encounter (Signed)
Thank you for the update.   Cc - Dr. Sabra Heck

## 2018-06-12 NOTE — Progress Notes (Signed)
GYNECOLOGY  VISIT   HPI: 57 y.o.   Single  Caucasian  female   G0P0000 with Patient's last menstrual period was 10/17/2014.   here for consult to discuss TVT midurethral sling.   Leaks urine with no rhyme or reason.  Has urgency when she gets close to the restroom or close to the front door.   Can have enuresis and dreams she is on the toilet at the time.  Leaks with sneeze sometimes.  Not so much leak with cough and laugh.   Typically does not leak with exercise.  Can have urgency and dribbling when she is out walking.  NF once.  One episode of leakage when she lifted a watermelon at the store.  Drinks [employer] and 2 coffees per day.   Considering hysterectomy with Dr. Sabra Heck.   Urodynamics from 02/14/15: Uroflow study - Void 393 cc. PRV 20 cc. Continuous pattern. CMG - S1 243 cc, S2 501 cc, S3 724 cc. Stable CMG. Lots of artifact. LPP 78 cm H2O at 725 cc. LPP 95 at 539 cc. UPP - 33 cm H20. Pressure flow study - Max det pressure 20 cm H20. Voided 653 cc of 724 cc. Valsalva at the end of the void.  Final result: Evidence of genuine stress incontinence.  Large bladder volume.   Had 14 week size uterus on examination 12/17/14.  No gluacoma.  No cardiac arrhythmia.   GYNECOLOGIC HISTORY: Patient's last menstrual period was 10/17/2014. Contraception:  Post menopausal status Menopausal hormone therapy:  None Last mammogram:  03/07/2018 Birads 1 negative Last pap smear:   01/17/2018 ASCUS negative HPV        OB History    Gravida  0   Para  0   Term  0   Preterm  0   AB  0   Living  0     SAB  0   TAB  0   Ectopic  0   Multiple  0   Live Births                 Patient Active Problem List   Diagnosis Date Noted  . Non-traumatic rotator cuff tear 07/01/2015  . Enlarged uterus 04/07/2015  . Intramural leiomyoma of uterus 04/07/2015  . 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 Arthralgia of shoulder 04/14/2014  . Depression 04/14/2014   . HLD (hyperlipidemia) 04/14/2014  . Adult hypothyroidism 04/14/2014    Past Medical History:  Diagnosis Date  . Abnormal Pap smear    h/o ascus-h pap, colpo negative  . Anemia    h/o  . Anxiety    stressors with fahter aging  . Elevated lipids   . Fibroids   . Hearing loss in left ear   . Hypothyroidism   . Obesity     Past Surgical History:  Procedure Laterality Date  . APPENDECTOMY  4/08  . cardiac evaluation  4/07   stress test, echo, EKG-mild MVP  . CHOLECYSTECTOMY, LAPAROSCOPIC  4/08  . excision of thyroid nodule  2/00  . EYE SURGERY Right 11/2014    Current Outpatient Medications  Medication Sig Dispense Refill  . ALPRAZolam (XANAX) 0.5 MG tablet as needed.  2  . Biotin 10 MG CAPS Take by mouth daily.    . Calcium Carbonate (CALCIUM 600 PO) Take by mouth daily.    . Cholecalciferol (D-5000) 5000 UNITS TABS Take by mouth daily.    . Cyanocobalamin (RA VITAMIN B-12 TR) 1000 MCG TBCR Take by mouth daily.    Marland Kitchen  FLUoxetine (PROZAC) 20 MG capsule     . Multiple Vitamin (MULTIVITAMIN) tablet Take 1 tablet by mouth daily.    . Omega-3 Fatty Acids (FISH OIL PO) Take by mouth.    . Eszopiclone 3 MG TABS Take 1 tablet (3 mg total) by mouth daily. (Patient taking differently: Take 3 mg by mouth daily. Takes 1/2 tablet nightly) 30 tablet 3  . levothyroxine (SYNTHROID, LEVOTHROID) 175 MCG tablet Take by mouth.    Marland Kitchen omeprazole (PRILOSEC) 40 MG capsule Take by mouth.    . oxybutynin (DITROPAN-XL) 5 MG 24 hr tablet Take one tablet by mouth daily. 30 tablet 1  . pravastatin (PRAVACHOL) 20 MG tablet Take by mouth.     No current facility-administered medications for this visit.      ALLERGIES: Codeine  Family History  Problem Relation Age of Onset  . Alzheimer's disease Mother 70       died  . CVA Mother   . Heart attack Father   . Kidney cancer Father   . Hypertension Father   . Hyperlipidemia Father   . Cancer Father        reoccurred 03/19/13/mets to lungs, adrenal  gland, and some lymph nodes    Social History   Socioeconomic History  . Marital status: Single    Spouse name: Not on file  . Number of children: Not on file  . Years of education: Not on file  . Highest education level: Not on file  Occupational History  . Not on file  Social Needs  . Financial resource strain: Not on file  . Food insecurity:    Worry: Not on file    Inability: Not on file  . Transportation needs:    Medical: Not on file    Non-medical: Not on file  Tobacco Use  . Smoking status: Never Smoker  . Smokeless tobacco: Never Used  Substance and Sexual Activity  . Alcohol use: Yes    Alcohol/week: 0.6 - 1.2 oz    Types: 1 - 2 Standard drinks or equivalent per week    Comment: rarely  . Drug use: No  . Sexual activity: Not Currently    Partners: Male    Birth control/protection: Abstinence, Post-menopausal  Lifestyle  . Physical activity:    Days per week: Not on file    Minutes per session: Not on file  . Stress: Not on file  Relationships  . Social connections:    Talks on phone: Not on file    Gets together: Not on file    Attends religious service: Not on file    Active member of club or organization: Not on file    Attends meetings of clubs or organizations: Not on file    Relationship status: Not on file  . Intimate partner violence:    Fear of current or ex partner: Not on file    Emotionally abused: Not on file    Physically abused: Not on file    Forced sexual activity: Not on file  Other Topics Concern  . Not on file  Social History Narrative  . Not on file    Review of Systems  Constitutional: Negative.   HENT: Negative.   Eyes: Negative.   Respiratory: Negative.   Cardiovascular: Negative.   Gastrointestinal: Negative.   Genitourinary: Positive for frequency and urgency.       Nocturia Loss of urine with sneeze and cough  Musculoskeletal: Negative.   Skin: Negative.   Neurological:  Difficulty with memory   Hematological: Negative.   Psychiatric/Behavioral: Negative.     PHYSICAL EXAMINATION:    BP 108/60 (BP Location: Right Arm, Patient Position: Sitting)   Pulse (!) 52   Wt 177 lb 3.2 oz (80.4 kg)   LMP 10/17/2014   BMI 28.60 kg/m     General appearance: alert, cooperative and appears stated age   Pelvic: External genitalia:  no lesions              Urethra:  normal appearing urethra with no masses, tenderness or lesions              Bartholins and Skenes: normal                 Vagina: normal appearing vagina with normal color and discharge, no lesions              Cervix: no lesions                Bimanual Exam:  Uterus:   8 - 9 week size.                Adnexa: no mass, fullness, tenderness              Rectal exam: Yes.  .  Confirms.              Anus:  normal sphincter tone, no lesions  Chaperone was present for exam.  ASSESSMENT  Mixed incontinence - Urgency and frequency >>> stress. Uterine fibroids reduced size in menopause.   PLAN  We reviewed mixed incontinence and treatment options for both overactive bladder and stress incontinence.  Physical therapy, medication, pessary, and midurethral sling surgery for stress incontinence. I clarified that overactive bladder is not treated with midurethral sling and that in fact, symptoms of urgency and frequency may increase temporarily following midurethral sling.  Midurethral sling reviewed with use of permanent mesh material. Uterine fibroids effect on urinary urgency/frequency and stress incontinence reviewed.  I am hesitant to tell her that her current uterine fibroid size is having an appreciable effect on her bladder dysfunction.  She would like to have a trial of Ditropan XL 5 mg to see if her bladder control improves appreciably. She will follow up with me and Dr. Sabra Heck in 6 weeks to discuss her care further.    An After Visit Summary was printed and given to the patient.  __25____ minutes face to face time of  which over 50% was spent in counseling.

## 2018-06-13 ENCOUNTER — Ambulatory Visit (INDEPENDENT_AMBULATORY_CARE_PROVIDER_SITE_OTHER): Payer: BLUE CROSS/BLUE SHIELD | Admitting: Obstetrics and Gynecology

## 2018-06-13 ENCOUNTER — Encounter: Payer: Self-pay | Admitting: Obstetrics and Gynecology

## 2018-06-13 ENCOUNTER — Other Ambulatory Visit: Payer: Self-pay

## 2018-06-13 VITALS — BP 108/60 | HR 52 | Wt 177.2 lb

## 2018-06-13 DIAGNOSIS — N3281 Overactive bladder: Secondary | ICD-10-CM | POA: Diagnosis not present

## 2018-06-13 DIAGNOSIS — N393 Stress incontinence (female) (male): Secondary | ICD-10-CM

## 2018-06-13 MED ORDER — OXYBUTYNIN CHLORIDE ER 5 MG PO TB24: ORAL_TABLET | ORAL | 1 refills | Status: DC

## 2018-06-20 DIAGNOSIS — F331 Major depressive disorder, recurrent, moderate: Secondary | ICD-10-CM | POA: Diagnosis not present

## 2018-06-20 DIAGNOSIS — F5105 Insomnia due to other mental disorder: Secondary | ICD-10-CM | POA: Diagnosis not present

## 2018-07-06 ENCOUNTER — Other Ambulatory Visit: Payer: Self-pay | Admitting: Obstetrics and Gynecology

## 2018-07-07 DIAGNOSIS — F331 Major depressive disorder, recurrent, moderate: Secondary | ICD-10-CM | POA: Diagnosis not present

## 2018-07-31 ENCOUNTER — Ambulatory Visit: Payer: BLUE CROSS/BLUE SHIELD | Admitting: Obstetrics & Gynecology

## 2018-07-31 ENCOUNTER — Encounter: Payer: Self-pay | Admitting: Obstetrics & Gynecology

## 2018-07-31 VITALS — BP 106/58 | HR 80 | Resp 16 | Ht 66.0 in | Wt 179.4 lb

## 2018-07-31 DIAGNOSIS — N852 Hypertrophy of uterus: Secondary | ICD-10-CM

## 2018-07-31 DIAGNOSIS — N3281 Overactive bladder: Secondary | ICD-10-CM | POA: Diagnosis not present

## 2018-07-31 NOTE — Progress Notes (Signed)
GYNECOLOGY  VISIT   HPI: 57 y.o.   Single  Caucasian  female   G0P0000 with Patient's last menstrual period was 10/17/2014.   here for overactive bladder follow up.    Ditropan XL 5 mg caused a slight improvement.  Patient worried about dementia. Her mother died of early onset of dementia at age 28.   Her stress incontinence is not consistent.  Her leakage is usually 3 feet from her front door and she cannot make it to the bathroom on time.  She does also have enuresis.  She dreams of sitting on the toilet and then wakes up and finds she has wet the bed.   She saw Dr. Sabra Heck yesterday. They discussed the potential effect of her fibroids on her bladder functioning.  She has been dealing with fibroids for many years.   Patient has concerns about reproductive cancer.   GYNECOLOGIC HISTORY: Patient's last menstrual period was 10/17/2014. Contraception:  Post menopausal status Menopausal hormone therapy:  none Last mammogram:  03/07/2018 Birads 1 negative Last pap smear:   01/17/2018 ASCUS negative HPV        OB History    Gravida  0   Para  0   Term  0   Preterm  0   AB  0   Living  0     SAB  0   TAB  0   Ectopic  0   Multiple  0   Live Births                 Patient Active Problem List   Diagnosis Date Noted  . Non-traumatic rotator cuff tear 07/01/2015  . Enlarged uterus 04/07/2015  . Intramural leiomyoma of uterus 04/07/2015  . 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 Arthralgia of shoulder 04/14/2014  . Depression 04/14/2014  . HLD (hyperlipidemia) 04/14/2014  . Adult hypothyroidism 04/14/2014    Past Medical History:  Diagnosis Date  . Abnormal Pap smear    h/o ascus-h pap, colpo negative  . Anemia    h/o  . Anxiety    stressors with fahter aging  . Elevated lipids   . Fibroids   . Hearing loss in left ear   . Hypothyroidism   . Obesity     Past Surgical History:  Procedure Laterality Date  . APPENDECTOMY  4/08  . cardiac  evaluation  4/07   stress test, echo, EKG-mild MVP  . CHOLECYSTECTOMY, LAPAROSCOPIC  4/08  . excision of thyroid nodule  2/00  . EYE SURGERY Right 11/2014    Current Outpatient Medications  Medication Sig Dispense Refill  . ALPRAZolam (XANAX) 0.5 MG tablet as needed.  2  . Biotin 10 MG CAPS Take by mouth daily.    . Calcium Carbonate (CALCIUM 600 PO) Take by mouth daily.    . Cholecalciferol (D-5000) 5000 UNITS TABS Take by mouth daily.    . Cyanocobalamin (RA VITAMIN B-12 TR) 1000 MCG TBCR Take by mouth daily.    . eszopiclone (LUNESTA) 1 MG TABS tablet Take 1 mg by mouth at bedtime as needed.  1  . FLUoxetine (PROZAC) 20 MG capsule     . Multiple Vitamin (MULTIVITAMIN) tablet Take 1 tablet by mouth daily.    . Omega-3 Fatty Acids (FISH OIL PO) Take by mouth.    . oxybutynin (DITROPAN-XL) 5 MG 24 hr tablet Take one tablet by mouth daily. 30 tablet 1  . levothyroxine (SYNTHROID, LEVOTHROID) 175 MCG tablet Take by mouth.    Marland Kitchen  omeprazole (PRILOSEC) 40 MG capsule Take by mouth.    . pravastatin (PRAVACHOL) 20 MG tablet Take by mouth.     No current facility-administered medications for this visit.      ALLERGIES: Codeine  Family History  Problem Relation Age of Onset  . Alzheimer's disease Mother 74       died  . CVA Mother   . Heart attack Father   . Kidney cancer Father   . Hypertension Father   . Hyperlipidemia Father   . Cancer Father        reoccurred 03/19/13/mets to lungs, adrenal gland, and some lymph nodes    Social History   Socioeconomic History  . Marital status: Single    Spouse name: Not on file  . Number of children: Not on file  . Years of education: Not on file  . Highest education level: Not on file  Occupational History  . Not on file  Social Needs  . Financial resource strain: Not on file  . Food insecurity:    Worry: Not on file    Inability: Not on file  . Transportation needs:    Medical: Not on file    Non-medical: Not on file  Tobacco Use   . Smoking status: Never Smoker  . Smokeless tobacco: Never Used  Substance and Sexual Activity  . Alcohol use: Yes    Alcohol/week: 1.0 - 2.0 standard drinks    Types: 1 - 2 Standard drinks or equivalent per week    Comment: rarely  . Drug use: No  . Sexual activity: Not Currently    Partners: Male    Birth control/protection: Abstinence, Post-menopausal  Lifestyle  . Physical activity:    Days per week: Not on file    Minutes per session: Not on file  . Stress: Not on file  Relationships  . Social connections:    Talks on phone: Not on file    Gets together: Not on file    Attends religious service: Not on file    Active member of club or organization: Not on file    Attends meetings of clubs or organizations: Not on file    Relationship status: Not on file  . Intimate partner violence:    Fear of current or ex partner: Not on file    Emotionally abused: Not on file    Physically abused: Not on file    Forced sexual activity: Not on file  Other Topics Concern  . Not on file  Social History Narrative  . Not on file    Review of Systems  Constitutional: Negative.   HENT: Negative.   Eyes: Negative.   Respiratory: Negative.   Cardiovascular: Negative.   Gastrointestinal: Negative.   Endocrine: Negative.   Genitourinary: Negative.   Musculoskeletal: Negative.   Skin: Negative.   Allergic/Immunologic: Negative.   Neurological: Negative.   Hematological: Negative.   Psychiatric/Behavioral: Negative.   All other systems reviewed and are negative.   PHYSICAL EXAMINATION:    BP 104/62   Pulse 64   Ht 5\' 6"  (1.676 m)   Wt 179 lb (81.2 kg)   LMP 10/17/2014   BMI 28.89 kg/m     General appearance: alert, cooperative and appears stated age    ASSESSMENT  Mixed incontinence.  Uterine fibroids.  Postmenopausal.  FH of early onset dementia.  PLAN  We discussed the differences of stress incontinence vs. Overactive bladder. We reviewed medical therapy for  OAB and the anticholinergic effect of  most the medications except Myrbetriq.  Stop Ditropan.  Refer to physical therapy.  If she does not have an adequate response to pelvic floor therapy, she will return to consider hysterectomy +/- Myrbetriq.  We did briefly discuss risks of hysterectomy - bleeding, infection, transfusion, DVT, PE, risk of reaction to anesthesia, need for reoperation.   An After Visit Summary was printed and given to the patient.  __25____ minutes face to face time of which over 50% was spent in counseling.

## 2018-07-31 NOTE — Progress Notes (Signed)
GYNECOLOGY  VISIT  CC:   Discussion of possible surgery  HPI: 57 y.o. G0P0000 Single White or Caucasian female here for hysterectomy consult.  Has been having worsening urinary function issues and did see Dr. Quincy Simmonds 06/13/18.  She has been on oxybutynin and this helped minimally.  She is concerned about the possible side effects of the medication especially the possibility of dementia.    Wants to talk about hysterectomy again.  Is not having vaginal bleeding or pelvic/back.  Concerned about possibility of cancer if leaves uterus. Reviewed procedure and risks/benefits.  Procedure discussed with patient.  Hospital stay, recovery and pain management all discussed.  Risks discussed including but not limited to bleeding, 1% risk of receiving a  transfusion, infection, 3-4% risk of bowel/bladder/ureteral/vascular injury discussed as well as possible need for additional surgery if injury does occur discussed.  DVT/PE and rare risk of death discussed.  My actual complications with prior surgeries discussed.  Vaginal cuff dehiscence discussed.  Hernia formation discussed.  Positioning and incision locations discussed.  Patient aware if pathology abnormal she may need additional treatment.  She is leaning towards not doing anything at this time.  Seeing Dr. Quincy Simmonds again tomorrow.   GYNECOLOGIC HISTORY: Patient's last menstrual period was 10/17/2014. Contraception: post menopausal  Menopausal hormone therapy: none  Patient Active Problem List   Diagnosis Date Noted  . Non-traumatic rotator cuff tear 07/01/2015  . Enlarged uterus 04/07/2015  . Intramural leiomyoma of uterus 04/07/2015  . 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 Arthralgia of shoulder 04/14/2014  . Depression 04/14/2014  . HLD (hyperlipidemia) 04/14/2014  . Adult hypothyroidism 04/14/2014    Past Medical History:  Diagnosis Date  . Abnormal Pap smear    h/o ascus-h pap, colpo negative  . Anemia    h/o  . Anxiety    stressors  with fahter aging  . Elevated lipids   . Fibroids   . Hearing loss in left ear   . Hypothyroidism   . Obesity     Past Surgical History:  Procedure Laterality Date  . APPENDECTOMY  4/08  . cardiac evaluation  4/07   stress test, echo, EKG-mild MVP  . CHOLECYSTECTOMY, LAPAROSCOPIC  4/08  . excision of thyroid nodule  2/00  . EYE SURGERY Right 11/2014    MEDS:   Current Outpatient Medications on File Prior to Visit  Medication Sig Dispense Refill  . ALPRAZolam (XANAX) 0.5 MG tablet as needed.  2  . Biotin 10 MG CAPS Take by mouth daily.    . Calcium Carbonate (CALCIUM 600 PO) Take by mouth daily.    . Cholecalciferol (D-5000) 5000 UNITS TABS Take by mouth daily.    . Cyanocobalamin (RA VITAMIN B-12 TR) 1000 MCG TBCR Take by mouth daily.    . eszopiclone (LUNESTA) 1 MG TABS tablet Take 1 mg by mouth at bedtime as needed.  1  . FLUoxetine (PROZAC) 20 MG capsule     . Multiple Vitamin (MULTIVITAMIN) tablet Take 1 tablet by mouth daily.    . Omega-3 Fatty Acids (FISH OIL PO) Take by mouth.    . oxybutynin (DITROPAN-XL) 5 MG 24 hr tablet Take one tablet by mouth daily. 30 tablet 1  . levothyroxine (SYNTHROID, LEVOTHROID) 175 MCG tablet Take by mouth.    Marland Kitchen omeprazole (PRILOSEC) 40 MG capsule Take by mouth.    . pravastatin (PRAVACHOL) 20 MG tablet Take by mouth.     No current facility-administered medications on file prior to visit.  ALLERGIES: Codeine  Family History  Problem Relation Age of Onset  . Alzheimer's disease Mother 34       died  . CVA Mother   . Heart attack Father   . Kidney cancer Father   . Hypertension Father   . Hyperlipidemia Father   . Cancer Father        reoccurred 03/19/13/mets to lungs, adrenal gland, and some lymph nodes    SH:  Single, non smoker  Review of Systems  Genitourinary: Positive for frequency and urgency.       Loss of urine spontaneously  Night urination     PHYSICAL EXAMINATION:    BP (!) 106/58 (BP Location: Right Arm,  Patient Position: Sitting, Cuff Size: Large)   Pulse 80   Resp 16   Ht 5\' 6"  (1.676 m)   Wt 179 lb 6.4 oz (81.4 kg)   LMP 10/17/2014   BMI 28.96 kg/m     General appearance: alert, cooperative and appears stated age No exam performed  Assessment: Fibroid uterus OAB  Plan: Continued to contemplate surgery but she is leaning away from it at this time May need medication changes to see if bladder will improve.  Surgery not recommended for bladder repair after last visit with Dr. Quincy Simmonds.   ~25 minutes spent with patient >50% of time was in face to face discussion of above.

## 2018-08-01 ENCOUNTER — Encounter: Payer: Self-pay | Admitting: Obstetrics and Gynecology

## 2018-08-01 ENCOUNTER — Ambulatory Visit: Payer: BLUE CROSS/BLUE SHIELD | Admitting: Obstetrics and Gynecology

## 2018-08-01 VITALS — BP 104/62 | HR 64 | Ht 66.0 in | Wt 179.0 lb

## 2018-08-01 DIAGNOSIS — N3946 Mixed incontinence: Secondary | ICD-10-CM | POA: Diagnosis not present

## 2018-08-11 DIAGNOSIS — F331 Major depressive disorder, recurrent, moderate: Secondary | ICD-10-CM | POA: Diagnosis not present

## 2018-08-15 ENCOUNTER — Telehealth: Payer: Self-pay | Admitting: Obstetrics & Gynecology

## 2018-08-15 NOTE — Telephone Encounter (Signed)
Routing to [employer]  for referral update and patient contact.

## 2018-08-15 NOTE — Telephone Encounter (Signed)
Patient called stating that when she saw Dr. Quincy Simmonds on 9/20, they discussed physical therapy. Patient stated that she had not heard anything and was wondering what the next steps for getting that started would be.

## 2018-08-20 NOTE — Telephone Encounter (Signed)
Call placed in reference to referral to [facility]. °

## 2018-09-01 DIAGNOSIS — M6281 Muscle weakness (generalized): Secondary | ICD-10-CM | POA: Diagnosis not present

## 2018-09-01 DIAGNOSIS — N393 Stress incontinence (female) (male): Secondary | ICD-10-CM | POA: Diagnosis not present

## 2018-09-01 DIAGNOSIS — M6289 Other specified disorders of muscle: Secondary | ICD-10-CM | POA: Diagnosis not present

## 2018-09-01 DIAGNOSIS — N3946 Mixed incontinence: Secondary | ICD-10-CM | POA: Diagnosis not present

## 2018-09-01 DIAGNOSIS — F331 Major depressive disorder, recurrent, moderate: Secondary | ICD-10-CM | POA: Diagnosis not present

## 2018-09-10 DIAGNOSIS — M6281 Muscle weakness (generalized): Secondary | ICD-10-CM | POA: Diagnosis not present

## 2018-09-10 DIAGNOSIS — N3941 Urge incontinence: Secondary | ICD-10-CM | POA: Diagnosis not present

## 2018-09-10 DIAGNOSIS — M62838 Other muscle spasm: Secondary | ICD-10-CM | POA: Diagnosis not present

## 2018-09-10 DIAGNOSIS — M6289 Other specified disorders of muscle: Secondary | ICD-10-CM | POA: Diagnosis not present

## 2018-09-10 NOTE — Telephone Encounter (Signed)
OK to close encounter or is further follow up necessary? °

## 2018-09-19 DIAGNOSIS — E7800 Pure hypercholesterolemia, unspecified: Secondary | ICD-10-CM | POA: Diagnosis not present

## 2018-09-19 DIAGNOSIS — D649 Anemia, unspecified: Secondary | ICD-10-CM | POA: Insufficient documentation

## 2018-09-19 DIAGNOSIS — M25552 Pain in left hip: Secondary | ICD-10-CM | POA: Diagnosis not present

## 2018-09-19 DIAGNOSIS — Z79899 Other long term (current) drug therapy: Secondary | ICD-10-CM | POA: Diagnosis not present

## 2018-09-19 DIAGNOSIS — E039 Hypothyroidism, unspecified: Secondary | ICD-10-CM | POA: Diagnosis not present

## 2018-09-22 DIAGNOSIS — F331 Major depressive disorder, recurrent, moderate: Secondary | ICD-10-CM | POA: Diagnosis not present

## 2018-10-02 DIAGNOSIS — M7062 Trochanteric bursitis, left hip: Secondary | ICD-10-CM | POA: Diagnosis not present

## 2018-10-02 DIAGNOSIS — M76891 Other specified enthesopathies of right lower limb, excluding foot: Secondary | ICD-10-CM | POA: Diagnosis not present

## 2018-10-02 DIAGNOSIS — M25552 Pain in left hip: Secondary | ICD-10-CM | POA: Diagnosis not present

## 2018-10-02 DIAGNOSIS — M47816 Spondylosis without myelopathy or radiculopathy, lumbar region: Secondary | ICD-10-CM | POA: Diagnosis not present

## 2018-10-07 DIAGNOSIS — M6289 Other specified disorders of muscle: Secondary | ICD-10-CM | POA: Diagnosis not present

## 2018-10-07 DIAGNOSIS — M62838 Other muscle spasm: Secondary | ICD-10-CM | POA: Diagnosis not present

## 2018-10-07 DIAGNOSIS — M6281 Muscle weakness (generalized): Secondary | ICD-10-CM | POA: Diagnosis not present

## 2018-10-07 DIAGNOSIS — N3941 Urge incontinence: Secondary | ICD-10-CM | POA: Diagnosis not present

## 2018-10-23 DIAGNOSIS — M62838 Other muscle spasm: Secondary | ICD-10-CM | POA: Diagnosis not present

## 2018-10-23 DIAGNOSIS — M6289 Other specified disorders of muscle: Secondary | ICD-10-CM | POA: Diagnosis not present

## 2018-10-23 DIAGNOSIS — N3941 Urge incontinence: Secondary | ICD-10-CM | POA: Diagnosis not present

## 2018-10-23 DIAGNOSIS — M6281 Muscle weakness (generalized): Secondary | ICD-10-CM | POA: Diagnosis not present

## 2018-11-26 DIAGNOSIS — D2262 Melanocytic nevi of left upper limb, including shoulder: Secondary | ICD-10-CM | POA: Diagnosis not present

## 2018-11-26 DIAGNOSIS — L821 Other seborrheic keratosis: Secondary | ICD-10-CM | POA: Diagnosis not present

## 2018-11-26 DIAGNOSIS — D225 Melanocytic nevi of trunk: Secondary | ICD-10-CM | POA: Diagnosis not present

## 2018-11-26 DIAGNOSIS — D2261 Melanocytic nevi of right upper limb, including shoulder: Secondary | ICD-10-CM | POA: Diagnosis not present

## 2018-12-04 DIAGNOSIS — F5105 Insomnia due to other mental disorder: Secondary | ICD-10-CM | POA: Diagnosis not present

## 2018-12-04 DIAGNOSIS — F331 Major depressive disorder, recurrent, moderate: Secondary | ICD-10-CM | POA: Diagnosis not present

## 2018-12-05 DIAGNOSIS — M6281 Muscle weakness (generalized): Secondary | ICD-10-CM | POA: Diagnosis not present

## 2018-12-05 DIAGNOSIS — N3941 Urge incontinence: Secondary | ICD-10-CM | POA: Diagnosis not present

## 2018-12-05 DIAGNOSIS — M62838 Other muscle spasm: Secondary | ICD-10-CM | POA: Diagnosis not present

## 2018-12-05 DIAGNOSIS — M6289 Other specified disorders of muscle: Secondary | ICD-10-CM | POA: Diagnosis not present

## 2018-12-08 DIAGNOSIS — F331 Major depressive disorder, recurrent, moderate: Secondary | ICD-10-CM | POA: Diagnosis not present

## 2018-12-09 ENCOUNTER — Telehealth: Payer: Self-pay | Admitting: Obstetrics & Gynecology

## 2018-12-09 NOTE — Telephone Encounter (Signed)
Patient stated that her physical therapist suggested that she speak with Dr. Sabra Heck regarding the possibility of a fibroid removal procedure.

## 2018-12-09 NOTE — Telephone Encounter (Signed)
At return call please schedule appointment with Dr. Sabra Heck.   Call to patient.  No answer.  Voicemail full.

## 2018-12-15 NOTE — Telephone Encounter (Signed)
Message left to return call to triage nurse.

## 2018-12-23 NOTE — Telephone Encounter (Signed)
Ok to close encounter. 

## 2018-12-23 NOTE — Telephone Encounter (Signed)
Call again to patient.  Voicemail on mobile confirms patient name, Sharon Hicks.  Unable to leave message. Voicemailbox full.   Called x 3 to patient with no response back. Okay to close encounter?

## 2018-12-24 ENCOUNTER — Telehealth: Payer: Self-pay | Admitting: *Deleted

## 2018-12-24 NOTE — Telephone Encounter (Signed)
Spoke with patient. Patient states she has a hx of fibroids and urinary incontinence, current seeing PT. Patient states she has discussed fibroids with PT and "a procedure similar to [facility] called [facility] was recommended for removing fibroids". Patient is unsure of the name of procedure, is asking if this would be an appropriate alternative to hysterectomy? Patient denies any new GYN symptoms.   Recommended OV for further evaluation and discussion with Dr. Sabra Heck, patient declined. Patient states she will wait until [facility] 04/02/19 to further discuss. Advised Dr. Sabra Heck will review, our office will return call if any additional recommendations.   Dr. Sabra Heck -please review.

## 2018-12-24 NOTE — Telephone Encounter (Signed)
Patient is returning call from a few days ago. She was out of town.

## 2018-12-26 DIAGNOSIS — M6281 Muscle weakness (generalized): Secondary | ICD-10-CM | POA: Diagnosis not present

## 2018-12-26 DIAGNOSIS — M6289 Other specified disorders of muscle: Secondary | ICD-10-CM | POA: Diagnosis not present

## 2018-12-26 DIAGNOSIS — N3941 Urge incontinence: Secondary | ICD-10-CM | POA: Diagnosis not present

## 2018-12-26 DIAGNOSIS — M62838 Other muscle spasm: Secondary | ICD-10-CM | POA: Diagnosis not present

## 2018-12-29 DIAGNOSIS — I83893 Varicose veins of bilateral lower extremities with other complications: Secondary | ICD-10-CM | POA: Diagnosis not present

## 2019-01-12 DIAGNOSIS — F331 Major depressive disorder, recurrent, moderate: Secondary | ICD-10-CM | POA: Diagnosis not present

## 2019-01-16 DIAGNOSIS — K5900 Constipation, unspecified: Secondary | ICD-10-CM | POA: Diagnosis not present

## 2019-01-16 DIAGNOSIS — R3915 Urgency of urination: Secondary | ICD-10-CM | POA: Diagnosis not present

## 2019-01-16 DIAGNOSIS — N3281 Overactive bladder: Secondary | ICD-10-CM | POA: Diagnosis not present

## 2019-01-16 DIAGNOSIS — N3946 Mixed incontinence: Secondary | ICD-10-CM | POA: Diagnosis not present

## 2019-02-04 ENCOUNTER — Other Ambulatory Visit: Payer: Self-pay | Admitting: Internal Medicine

## 2019-02-04 DIAGNOSIS — Z1231 Encounter for screening mammogram for malignant neoplasm of breast: Secondary | ICD-10-CM

## 2019-02-20 ENCOUNTER — Encounter

## 2019-03-11 DIAGNOSIS — F331 Major depressive disorder, recurrent, moderate: Secondary | ICD-10-CM | POA: Diagnosis not present

## 2019-03-19 DIAGNOSIS — F331 Major depressive disorder, recurrent, moderate: Secondary | ICD-10-CM | POA: Diagnosis not present

## 2019-03-19 DIAGNOSIS — F5105 Insomnia due to other mental disorder: Secondary | ICD-10-CM | POA: Diagnosis not present

## 2019-03-23 DIAGNOSIS — F331 Major depressive disorder, recurrent, moderate: Secondary | ICD-10-CM | POA: Diagnosis not present

## 2019-03-27 DIAGNOSIS — E039 Hypothyroidism, unspecified: Secondary | ICD-10-CM | POA: Diagnosis not present

## 2019-03-27 DIAGNOSIS — Z0000 Encounter for general adult medical examination without abnormal findings: Secondary | ICD-10-CM | POA: Diagnosis not present

## 2019-03-27 DIAGNOSIS — D649 Anemia, unspecified: Secondary | ICD-10-CM | POA: Diagnosis not present

## 2019-03-27 DIAGNOSIS — E7800 Pure hypercholesterolemia, unspecified: Secondary | ICD-10-CM | POA: Diagnosis not present

## 2019-03-27 DIAGNOSIS — M542 Cervicalgia: Secondary | ICD-10-CM | POA: Diagnosis not present

## 2019-04-02 ENCOUNTER — Ambulatory Visit: Payer: BLUE CROSS/BLUE SHIELD | Admitting: Obstetrics & Gynecology

## 2019-04-20 DIAGNOSIS — F331 Major depressive disorder, recurrent, moderate: Secondary | ICD-10-CM | POA: Diagnosis not present

## 2019-04-22 DIAGNOSIS — M62838 Other muscle spasm: Secondary | ICD-10-CM | POA: Diagnosis not present

## 2019-04-22 DIAGNOSIS — N3941 Urge incontinence: Secondary | ICD-10-CM | POA: Diagnosis not present

## 2019-04-22 DIAGNOSIS — M6289 Other specified disorders of muscle: Secondary | ICD-10-CM | POA: Diagnosis not present

## 2019-04-22 DIAGNOSIS — M6281 Muscle weakness (generalized): Secondary | ICD-10-CM | POA: Diagnosis not present

## 2019-04-27 DIAGNOSIS — S40861A Insect bite (nonvenomous) of right upper arm, initial encounter: Secondary | ICD-10-CM | POA: Diagnosis not present

## 2019-04-27 DIAGNOSIS — S20461A Insect bite (nonvenomous) of right back wall of thorax, initial encounter: Secondary | ICD-10-CM | POA: Diagnosis not present

## 2019-04-27 DIAGNOSIS — S40862A Insect bite (nonvenomous) of left upper arm, initial encounter: Secondary | ICD-10-CM | POA: Diagnosis not present

## 2019-04-27 DIAGNOSIS — S30860A Insect bite (nonvenomous) of lower back and pelvis, initial encounter: Secondary | ICD-10-CM | POA: Diagnosis not present

## 2019-05-21 ENCOUNTER — Ambulatory Visit
Admission: RE | Admit: 2019-05-21 | Discharge: 2019-05-21 | Disposition: A | Discharge status: Home / Self Care | Payer: BC Managed Care – PPO | Source: Ambulatory Visit | Attending: Internal Medicine | Admitting: Internal Medicine

## 2019-05-21 ENCOUNTER — Other Ambulatory Visit: Payer: Self-pay

## 2019-05-21 DIAGNOSIS — Z1231 Encounter for screening mammogram for malignant neoplasm of breast: Secondary | ICD-10-CM | POA: Diagnosis not present

## 2019-06-02 ENCOUNTER — Other Ambulatory Visit: Payer: Self-pay

## 2019-06-02 ENCOUNTER — Other Ambulatory Visit: Payer: Self-pay | Admitting: Obstetrics & Gynecology

## 2019-06-04 ENCOUNTER — Other Ambulatory Visit (HOSPITAL_COMMUNITY)
Admission: RE | Admit: 2019-06-04 | Discharge: 2019-06-04 | Disposition: A | Discharge status: Home / Self Care | Payer: BC Managed Care – PPO | Source: Ambulatory Visit | Attending: Obstetrics & Gynecology | Admitting: Obstetrics & Gynecology

## 2019-06-04 ENCOUNTER — Ambulatory Visit: Payer: BC Managed Care – PPO | Admitting: Obstetrics & Gynecology

## 2019-06-04 ENCOUNTER — Other Ambulatory Visit: Payer: Self-pay

## 2019-06-04 ENCOUNTER — Encounter: Payer: Self-pay | Admitting: Obstetrics & Gynecology

## 2019-06-04 VITALS — BP 102/60 | HR 64 | Temp 97.3°F | Ht 66.0 in | Wt 182.2 lb

## 2019-06-04 DIAGNOSIS — M6289 Other specified disorders of muscle: Secondary | ICD-10-CM | POA: Diagnosis not present

## 2019-06-04 DIAGNOSIS — M6281 Muscle weakness (generalized): Secondary | ICD-10-CM | POA: Diagnosis not present

## 2019-06-04 DIAGNOSIS — N3941 Urge incontinence: Secondary | ICD-10-CM | POA: Diagnosis not present

## 2019-06-04 DIAGNOSIS — Z01419 Encounter for gynecological examination (general) (routine) without abnormal findings: Secondary | ICD-10-CM

## 2019-06-04 DIAGNOSIS — Z124 Encounter for screening for malignant neoplasm of cervix: Secondary | ICD-10-CM | POA: Insufficient documentation

## 2019-06-04 DIAGNOSIS — M62838 Other muscle spasm: Secondary | ICD-10-CM | POA: Diagnosis not present

## 2019-06-04 NOTE — Progress Notes (Signed)
58 y.o. G0P0000 Single White or Caucasian female here for annual exam.  Doing well.  Denies vaginal bleeding.    Has been using some [employer] baby powder with talc  Now using cornstarch   Patient's last menstrual period was 10/17/2014.          Sexually active: No.  The current method of family planning is post menopausal status.    Exercising: Yes.    walking  Smoker:  no  Health Maintenance: Pap:  01/17/18 ASCUS. HR HPV:neg   12/17/16 Neg. HR HPV:neg  History of abnormal Pap:  yes MMG:  05/21/19 BIRADS1:neg  Colonoscopy:  2011 f/u 10 years BMD:   2018 TDaP:  2012 Pneumonia vaccine(s):  n/a Shingrix:   Zostavax  Hep C testing: 2018 Neg Screening Labs: PCP   reports that she has never smoked. She has never used smokeless tobacco. She reports current alcohol use of about 1.0 standard drinks of alcohol per week. She reports that she does not use drugs.  Past Medical History:  Diagnosis Date  . Abnormal Pap smear    h/o ascus-h pap, colpo negative  . Anemia    h/o  . Anxiety    stressors with fahter aging  . Elevated lipids   . Fibroids   . Hearing loss in left ear   . Hypothyroidism   . Obesity     Past Surgical History:  Procedure Laterality Date  . APPENDECTOMY  4/08  . cardiac evaluation  4/07   stress test, echo, EKG-mild MVP  . CHOLECYSTECTOMY, LAPAROSCOPIC  4/08  . excision of thyroid nodule  2/00  . EYE SURGERY Right 11/2014    Current Outpatient Medications  Medication Sig Dispense Refill  . ALPRAZolam (XANAX) 0.5 MG tablet as needed.  2  . Biotin 10 MG CAPS Take by mouth daily.    . Calcium Carbonate (CALCIUM 600 PO) Take by mouth daily.    . Cholecalciferol (D-5000) 5000 UNITS TABS Take by mouth daily.    . Cyanocobalamin (RA VITAMIN B-12 TR) 1000 MCG TBCR Take by mouth daily.    . diclofenac sodium (VOLTAREN) 1 % GEL Apply topically.    . eszopiclone (LUNESTA) 1 MG TABS tablet Take 1 mg by mouth at bedtime as needed.  1  . ferrous sulfate 325  (65 FE) MG tablet Take by mouth.    Marland Kitchen FLUoxetine (PROZAC) 20 MG capsule     . Multiple Vitamin (MULTIVITAMIN) tablet Take 1 tablet by mouth daily.    . Omega-3 Fatty Acids (FISH OIL PO) Take by mouth.    . levothyroxine (SYNTHROID, LEVOTHROID) 175 MCG tablet Take by mouth.    Marland Kitchen omeprazole (PRILOSEC) 40 MG capsule Take by mouth.    . pravastatin (PRAVACHOL) 20 MG tablet Take by mouth.     No current facility-administered medications for this visit.     Family History  Problem Relation Age of Onset  . Alzheimer's disease Mother 40       died  . CVA Mother   . Heart attack Father   . Kidney cancer Father   . Hypertension Father   . Hyperlipidemia Father   . Cancer Father        reoccurred 03/19/13/mets to lungs, adrenal gland, and some lymph nodes    Review of Systems  All other systems reviewed and are negative.   Exam:   BP 102/60   Pulse 64   Temp (!) 97.3 F (36.3 C) (Temporal)   Ht 5'  6" (1.676 m)   Wt 182 lb 3.2 oz (82.6 kg)   LMP 10/17/2014   BMI 29.41 kg/m   Height: 5\' 6"  (167.6 cm)  Ht Readings from Last 3 Encounters:  06/04/19 5\' 6"  (1.676 m)  08/01/18 5\' 6"  (1.676 m)  07/31/18 5\' 6"  (1.676 m)   General appearance: alert, cooperative and appears stated age Head: Normocephalic, without obvious abnormality, atraumatic Neck: no adenopathy, supple, symmetrical, trachea midline and thyroid normal to inspection and palpation Lungs: clear to auscultation bilaterally Breasts: normal appearance, no masses or tenderness Heart: regular rate and rhythm Abdomen: soft, non-tender; bowel sounds normal; no masses,  no organomegaly Extremities: extremities normal, atraumatic, no cyanosis or edema Skin: Skin color, texture, turgor normal. No rashes or lesions Lymph nodes: Cervical, supraclavicular, and axillary nodes normal. No abnormal inguinal nodes palpated Neurologic: Grossly normal   Pelvic: External genitalia:  no lesions              Urethra:  normal appearing  urethra with no masses, tenderness or lesions              Bartholins and Skenes: normal                 Vagina: normal appearing vagina with normal color and discharge, no lesions              Cervix: no lesions              Pap taken: Yes.   Bimanual Exam:  Uterus:  enlarged, 10-12 weeks size              Adnexa: no mass, fullness, tenderness               Rectovaginal: Confirms               Anus:  normal sphincter tone, no lesions  Chaperone was present for exam.  A:  Well Woman with normal exam PMP, no HRT H/o ASCUS-H pap 6/12, neg pap with neg HR HPV 7/14, ASCUS pap with neg HR HPV 11/15, ASCUS with neg HR HPV 12/12/15, neg pap with neg HR HPV 2/18 Fibroid uterus H/o elevated lipids  P:   Mammogram guidelines reviewed.  Doing 3D MMG. pap smear obtained today Shingrix vaccination discussed today Colonoscopy will be due next year Pt will call if using talc based baby powder for PUS scheduling Return annually or prn

## 2019-06-08 LAB — CYTOLOGY - PAP: Diagnosis: NEGATIVE

## 2019-06-17 ENCOUNTER — Encounter: Payer: Self-pay | Admitting: Podiatry

## 2019-06-17 ENCOUNTER — Other Ambulatory Visit: Payer: Self-pay

## 2019-06-17 ENCOUNTER — Ambulatory Visit: Payer: BC Managed Care – PPO | Admitting: Podiatry

## 2019-06-17 VITALS — BP 104/56 | HR 66 | Temp 98.0°F | Resp 16

## 2019-06-17 DIAGNOSIS — L603 Nail dystrophy: Secondary | ICD-10-CM | POA: Diagnosis not present

## 2019-06-17 DIAGNOSIS — M7981 Nontraumatic hematoma of soft tissue: Secondary | ICD-10-CM | POA: Diagnosis not present

## 2019-06-17 DIAGNOSIS — L608 Other nail disorders: Secondary | ICD-10-CM | POA: Diagnosis not present

## 2019-06-17 NOTE — Progress Notes (Signed)
Subjective:  Patient ID: Sharon Hicks, female    DOB: 1961/08/05,  MRN: [id number] HPI Chief Complaint  Patient presents with  . Nail Problem    Toenails bilateral - thick and discolored nails x years, feels like 2nd toenail left hits the inside end of the shoe  . New Patient (Initial Visit)    58 y.o. female presents with the above complaint.   ROS: Denies fever chills nausea vomiting muscle aches pains calf pain back pain chest pain shortness of breath.  Past Medical History:  Diagnosis Date  . Abnormal Pap smear    h/o ascus-h pap, colpo negative  . Anemia    h/o  . Anxiety    stressors with fahter aging  . Elevated lipids   . Fibroids   . Hearing loss in left ear   . Hypothyroidism   . Obesity    Past Surgical History:  Procedure Laterality Date  . APPENDECTOMY  4/08  . cardiac evaluation  4/07   stress test, echo, EKG-mild MVP  . CHOLECYSTECTOMY, LAPAROSCOPIC  4/08  . excision of thyroid nodule  2/00  . EYE SURGERY Right 11/2014    Current Outpatient Medications:  .  ALPRAZolam (XANAX) 0.5 MG tablet, as needed., Disp: , Rfl: 2 .  Biotin 10 MG CAPS, Take by mouth daily., Disp: , Rfl:  .  Calcium Carbonate (CALCIUM 600 PO), Take by mouth daily., Disp: , Rfl:  .  Cholecalciferol (D-5000) 5000 UNITS TABS, Take by mouth daily., Disp: , Rfl:  .  Cyanocobalamin (RA VITAMIN B-12 TR) 1000 MCG TBCR, Take by mouth daily., Disp: , Rfl:  .  diclofenac sodium (VOLTAREN) 1 % GEL, Apply topically., Disp: , Rfl:  .  eszopiclone (LUNESTA) 1 MG TABS tablet, Take 1 mg by mouth at bedtime as needed., Disp: , Rfl: 1 .  ferrous sulfate 325 (65 FE) MG tablet, Take by mouth., Disp: , Rfl:  .  FLUoxetine (PROZAC) 20 MG capsule, , Disp: , Rfl:  .  levothyroxine (SYNTHROID, LEVOTHROID) 175 MCG tablet, Take by mouth., Disp: , Rfl:  .  Multiple Vitamin (MULTIVITAMIN) tablet, Take 1 tablet by mouth daily., Disp: , Rfl:  .  Omega-3 Fatty Acids (FISH OIL PO), Take by mouth., Disp: , Rfl:  .   omeprazole (PRILOSEC) 40 MG capsule, Take by mouth., Disp: , Rfl:  .  pravastatin (PRAVACHOL) 20 MG tablet, Take by mouth., Disp: , Rfl:   Allergies  Allergen Reactions  . Codeine Nausea And Vomiting   Review of Systems Objective:   Vitals:   06/17/19 1444  BP: (!) 104/56  Pulse: 66  Resp: 16  Temp: 98 F (36.7 C)    General: Well developed, nourished, in no acute distress, alert and oriented x3   Dermatological: Skin is warm, dry and supple bilateral. Nails x 10 are well maintained; remaining integument appears unremarkable at this time. There are no open sores, no preulcerative lesions, no rash or signs of infection present.  Some thickening and discoloration of the toenails particularly second digit left foot and some degree the hallux left.  Vascular: Dorsalis Pedis artery and Posterior Tibial artery pedal pulses are 2/4 bilateral with immedate capillary fill time. Pedal hair growth present. No varicosities and no lower extremity edema present bilateral.   Neruologic: Grossly intact via light touch bilateral. Vibratory intact via tuning fork bilateral. Protective threshold with Semmes Wienstein monofilament intact to all pedal sites bilateral. Patellar and Achilles deep tendon reflexes 2+ bilateral. No Babinski or clonus  noted bilateral.   Musculoskeletal: No gross boney pedal deformities bilateral. No pain, crepitus, or limitation noted with foot and ankle range of motion bilateral. Muscular strength 5/5 in all groups tested bilateral.  Gait: Unassisted, Nonantalgic.    Radiographs:  None taken  Assessment & Plan:   Assessment: Nail dystrophy cannot rule out onychomycosis  Plan: Samples of the nails skin were taken today for pathologic evaluation.      T. [place], [place]

## 2019-06-18 DIAGNOSIS — M62838 Other muscle spasm: Secondary | ICD-10-CM | POA: Diagnosis not present

## 2019-06-18 DIAGNOSIS — M6289 Other specified disorders of muscle: Secondary | ICD-10-CM | POA: Diagnosis not present

## 2019-06-18 DIAGNOSIS — M6281 Muscle weakness (generalized): Secondary | ICD-10-CM | POA: Diagnosis not present

## 2019-06-18 DIAGNOSIS — N3941 Urge incontinence: Secondary | ICD-10-CM | POA: Diagnosis not present

## 2019-06-19 ENCOUNTER — Telehealth: Payer: Self-pay | Admitting: Obstetrics & Gynecology

## 2019-06-19 DIAGNOSIS — Z79899 Other long term (current) drug therapy: Secondary | ICD-10-CM

## 2019-06-19 NOTE — Telephone Encounter (Signed)
Reviewed 06/04/19 AEX notes per Dr. Sabra Heck -Pt will call if using talc based baby powder for PUS scheduling.    Spoke with patient. PUS scheduled for 8/27 at 3:30pm, consult at 4pm with Dr. Sabra Heck. Patient verbalizes understanding and is agreeable.   Dr. Sabra Heck -please confirm PUS Diagnosis.

## 2019-06-19 NOTE — Telephone Encounter (Signed)
Patient calling to notify Dr. Sabra Heck of the type of baby powder she uses. States it is [employer] baby powder with talc. Patient states she is unsure if this is the type that would require her to have a pelvic ultrasound or not and would like a call back regarding this.

## 2019-07-06 DIAGNOSIS — F331 Major depressive disorder, recurrent, moderate: Secondary | ICD-10-CM | POA: Diagnosis not present

## 2019-07-07 ENCOUNTER — Other Ambulatory Visit: Payer: Self-pay

## 2019-07-09 ENCOUNTER — Telehealth: Payer: Self-pay

## 2019-07-09 ENCOUNTER — Ambulatory Visit (INDEPENDENT_AMBULATORY_CARE_PROVIDER_SITE_OTHER): Payer: BC Managed Care – PPO | Admitting: Obstetrics & Gynecology

## 2019-07-09 ENCOUNTER — Other Ambulatory Visit: Payer: Self-pay

## 2019-07-09 ENCOUNTER — Ambulatory Visit (INDEPENDENT_AMBULATORY_CARE_PROVIDER_SITE_OTHER): Payer: BC Managed Care – PPO

## 2019-07-09 VITALS — BP 120/70 | HR 68 | Temp 97.3°F | Ht 66.0 in | Wt 186.0 lb

## 2019-07-09 DIAGNOSIS — Z79899 Other long term (current) drug therapy: Secondary | ICD-10-CM

## 2019-07-09 DIAGNOSIS — N852 Hypertrophy of uterus: Secondary | ICD-10-CM | POA: Diagnosis not present

## 2019-07-09 DIAGNOSIS — D250 Submucous leiomyoma of uterus: Secondary | ICD-10-CM

## 2019-07-09 NOTE — Telephone Encounter (Signed)
-----   Message from Andres Ege, RN sent at 07/07/2019  1:29 PM EDT ----- Janace Hoard, please assist pt. Marcy Siren ----- Message ----- From: Garrel Ridgel, [place] Sent: 07/07/2019   7:10 AM EDT To: Andres Ege, RN  Nail dystrophy.  Trauma. NO FUNGUS.  Cancel follow up appointment

## 2019-07-09 NOTE — Progress Notes (Signed)
58 y.o. G0P0000 Single White or Caucasian female here for pelvic ultrasound due to fibroids and pt reported history of baby powder use.  She is now using non-talc based powder.  Denies vaginal bleeding.  Patient's last menstrual period was 10/17/2014.  Contraception: PMP  Findings:  UTERUS: 7.2 x 6.6 x 4.5cm with multiple fibroids 3.4cm, much smaller from prior exam EMS: 4.9 - 5.53mm, distorted due to fibroids ADNEXA: Left ovary:  1.7 x 1.0 x 0.5cm       Right ovary:  1.3 x 0.7 x 0.6cm.  Ovaries appear atrophic. CUL DE SAC: no free fluid  Discussion:  Findings reviewed.  Pt aware ovaries appear normal and PMP.  Fibroids are much smaller in comparison to ultrasound from about four years ago.  She is aware that the endometrium is distorted by fibroids.  She is not bleeding.  If she does have bleeding, she knows to call and return for endometrial biopsy.  Assessment:  Fibroid uterus Use of talc based baby powder Possible thickened endometrium that is distorted by fibroids  Plan:   Pt is to call with any vaginal bleeding for endometrial biopsy and will return for AEX as scheduled.  ~15 minutes spent with patient >50% of time was in face to face discussion of above.

## 2019-07-09 NOTE — Telephone Encounter (Signed)
I tried to contact patient, no answer to home # and voice mail full on moile #

## 2019-07-12 ENCOUNTER — Encounter: Payer: Self-pay | Admitting: Obstetrics & Gynecology

## 2019-07-15 ENCOUNTER — Ambulatory Visit: Payer: BC Managed Care – PPO | Admitting: Podiatry

## 2019-07-21 DIAGNOSIS — M62838 Other muscle spasm: Secondary | ICD-10-CM | POA: Diagnosis not present

## 2019-07-21 DIAGNOSIS — M6281 Muscle weakness (generalized): Secondary | ICD-10-CM | POA: Diagnosis not present

## 2019-07-21 DIAGNOSIS — N3941 Urge incontinence: Secondary | ICD-10-CM | POA: Diagnosis not present

## 2019-07-21 DIAGNOSIS — M6289 Other specified disorders of muscle: Secondary | ICD-10-CM | POA: Diagnosis not present

## 2019-08-06 DIAGNOSIS — M6289 Other specified disorders of muscle: Secondary | ICD-10-CM | POA: Diagnosis not present

## 2019-08-06 DIAGNOSIS — M6281 Muscle weakness (generalized): Secondary | ICD-10-CM | POA: Diagnosis not present

## 2019-08-06 DIAGNOSIS — M62838 Other muscle spasm: Secondary | ICD-10-CM | POA: Diagnosis not present

## 2019-08-06 DIAGNOSIS — N3941 Urge incontinence: Secondary | ICD-10-CM | POA: Diagnosis not present

## 2019-08-19 DIAGNOSIS — F331 Major depressive disorder, recurrent, moderate: Secondary | ICD-10-CM | POA: Diagnosis not present

## 2019-08-20 DIAGNOSIS — N3941 Urge incontinence: Secondary | ICD-10-CM | POA: Diagnosis not present

## 2019-08-20 DIAGNOSIS — M6281 Muscle weakness (generalized): Secondary | ICD-10-CM | POA: Diagnosis not present

## 2019-08-20 DIAGNOSIS — M62838 Other muscle spasm: Secondary | ICD-10-CM | POA: Diagnosis not present

## 2019-08-20 DIAGNOSIS — M6289 Other specified disorders of muscle: Secondary | ICD-10-CM | POA: Diagnosis not present

## 2019-09-02 DIAGNOSIS — M6289 Other specified disorders of muscle: Secondary | ICD-10-CM | POA: Diagnosis not present

## 2019-09-02 DIAGNOSIS — M6281 Muscle weakness (generalized): Secondary | ICD-10-CM | POA: Diagnosis not present

## 2019-09-02 DIAGNOSIS — M7062 Trochanteric bursitis, left hip: Secondary | ICD-10-CM | POA: Diagnosis not present

## 2019-09-02 DIAGNOSIS — M62838 Other muscle spasm: Secondary | ICD-10-CM | POA: Diagnosis not present

## 2019-09-24 DIAGNOSIS — Z20828 Contact with and (suspected) exposure to other viral communicable diseases: Secondary | ICD-10-CM | POA: Diagnosis not present

## 2019-09-28 DIAGNOSIS — F331 Major depressive disorder, recurrent, moderate: Secondary | ICD-10-CM | POA: Diagnosis not present

## 2019-10-06 DIAGNOSIS — E039 Hypothyroidism, unspecified: Secondary | ICD-10-CM | POA: Diagnosis not present

## 2019-10-06 DIAGNOSIS — M25511 Pain in right shoulder: Secondary | ICD-10-CM | POA: Diagnosis not present

## 2019-10-06 DIAGNOSIS — F3341 Major depressive disorder, recurrent, in partial remission: Secondary | ICD-10-CM | POA: Diagnosis not present

## 2019-10-06 DIAGNOSIS — E7800 Pure hypercholesterolemia, unspecified: Secondary | ICD-10-CM | POA: Diagnosis not present

## 2019-10-13 DIAGNOSIS — N3941 Urge incontinence: Secondary | ICD-10-CM | POA: Diagnosis not present

## 2019-10-13 DIAGNOSIS — M6281 Muscle weakness (generalized): Secondary | ICD-10-CM | POA: Diagnosis not present

## 2019-10-13 DIAGNOSIS — M6289 Other specified disorders of muscle: Secondary | ICD-10-CM | POA: Diagnosis not present

## 2019-10-13 DIAGNOSIS — M62838 Other muscle spasm: Secondary | ICD-10-CM | POA: Diagnosis not present

## 2019-10-14 DIAGNOSIS — M8580 Other specified disorders of bone density and structure, unspecified site: Secondary | ICD-10-CM | POA: Diagnosis not present

## 2019-10-14 DIAGNOSIS — M8588 Other specified disorders of bone density and structure, other site: Secondary | ICD-10-CM | POA: Diagnosis not present

## 2019-10-22 DIAGNOSIS — Z8249 Family history of ischemic heart disease and other diseases of the circulatory system: Secondary | ICD-10-CM | POA: Diagnosis not present

## 2019-10-23 DIAGNOSIS — M75101 Unspecified rotator cuff tear or rupture of right shoulder, not specified as traumatic: Secondary | ICD-10-CM | POA: Diagnosis not present

## 2019-10-23 DIAGNOSIS — M7061 Trochanteric bursitis, right hip: Secondary | ICD-10-CM | POA: Diagnosis not present

## 2019-10-23 DIAGNOSIS — M25511 Pain in right shoulder: Secondary | ICD-10-CM | POA: Diagnosis not present

## 2019-10-23 DIAGNOSIS — M25551 Pain in right hip: Secondary | ICD-10-CM | POA: Diagnosis not present

## 2019-11-11 DIAGNOSIS — M62838 Other muscle spasm: Secondary | ICD-10-CM | POA: Diagnosis not present

## 2019-11-11 DIAGNOSIS — M6289 Other specified disorders of muscle: Secondary | ICD-10-CM | POA: Diagnosis not present

## 2019-11-11 DIAGNOSIS — N3941 Urge incontinence: Secondary | ICD-10-CM | POA: Diagnosis not present

## 2019-11-11 DIAGNOSIS — M6281 Muscle weakness (generalized): Secondary | ICD-10-CM | POA: Diagnosis not present

## 2019-11-12 DIAGNOSIS — L658 Other specified nonscarring hair loss: Secondary | ICD-10-CM | POA: Diagnosis not present

## 2020-02-18 ENCOUNTER — Telehealth: Payer: Self-pay

## 2020-02-18 NOTE — Telephone Encounter (Signed)
Patient called to get Dr. Sanjuan Dame opinion on the COVID vaccine. Patient would like to be called back before 8:30 (02/19/20).

## 2020-02-19 NOTE — Telephone Encounter (Signed)
Spoke with patient. Patient received her first Moderna Covid 19 vaccine this morning. Patient was calling to ask if vaccine is recommended?   Advised patient Dr. Sabra Heck does recommend Covid 19 vaccine.   Patient asking if Dr.Miller has any recommendations on long term effects, such as 5 -10 years from now? Advised patient I don't know that we will have that information at this time since the vaccine is new. Recommended patient review FAQ on http://trevino.[email or web address]/.  Questions answered.   Advised patient Dr. Sabra Heck will review, our office will f/u if any additional recommendations.   Routing to provider for final review. Patient is agreeable to disposition. Will close encounter.

## 2020-03-01 ENCOUNTER — Other Ambulatory Visit: Payer: Self-pay | Admitting: Internal Medicine

## 2020-03-01 DIAGNOSIS — Z1231 Encounter for screening mammogram for malignant neoplasm of breast: Secondary | ICD-10-CM

## 2020-06-02 ENCOUNTER — Ambulatory Visit
Admission: RE | Admit: 2020-06-02 | Discharge: 2020-06-02 | Disposition: A | Discharge status: Home / Self Care | Payer: BC Managed Care – PPO | Source: Ambulatory Visit | Attending: Internal Medicine | Admitting: Internal Medicine

## 2020-06-02 DIAGNOSIS — Z1231 Encounter for screening mammogram for malignant neoplasm of breast: Secondary | ICD-10-CM | POA: Diagnosis not present

## 2020-08-02 NOTE — Progress Notes (Signed)
59 y.o. G0P0000 Single White or Caucasian female here for annual exam.  Having more issues this year with depression.  She lost her 31 1/2 year old cat a couple of years ago.  She had a 30 yo cat that was hit by a car in July.  She cat ended up seeing a vet neurologist.  After another week, she had to have the cat euthanized.  She's now seeing a Social worker.  She is just increased her dose of fluoxetine and is also on another anxiolytic.  She is trying not to use very much benzodiazepine for anxiety.  Also, there is a house in the [place] area that is going on the market that is something she has been interested in to increase as her house.  Her brother is very supportive of her doing this but the stressors that this is added have compounded with going on with her emotionally related to the death of the cat.  House will need work before she can move in including both bathrooms updated.  In addition she still has her father's house that she is paying monthly maintenance on.  No one lives in it.  They have sitters selling it but they are still things and the need to be removed and she finds this also quite difficult.  She reports she is having trouble focusing at work and feels her performance is not as good as it should be.  Patient had a visit with PCP in May 2021.    Denies vaginal bleeding.    Patient's last menstrual period was 10/17/2014.          Sexually active: No.  The current method of family planning is post menopausal status.    Exercising: Yes.    occasional walking Smoker:  no  Health Maintenance: Pap:  01-17-18 ASCUS HPV HR neg, 06-04-2019 neg History of abnormal Pap:  yes MMG:  06-03-2020 category b density birads 1:neg Colonoscopy:  2011 f/u 77yrs.  Pt aware this is due.   BMD:   2018 TDaP:  2012 Pneumonia vaccine(s):  n/a Shingrix:   zostavax Hep C testing: neg 2018 Screening Labs: PCP   reports that she has never smoked. She has never used smokeless tobacco. She reports  current alcohol use of about 1.0 standard drink of alcohol per week. She reports that she does not use drugs.  Past Medical History:  Diagnosis Date  . Abnormal Pap smear    h/o ascus-h pap, colpo negative  . Anemia    h/o  . Anxiety    stressors with fahter aging  . Elevated lipids   . Fibroids   . Hearing loss in left ear   . Hypothyroidism   . Obesity     Past Surgical History:  Procedure Laterality Date  . APPENDECTOMY  4/08  . cardiac evaluation  4/07   stress test, echo, EKG-mild MVP  . CHOLECYSTECTOMY, LAPAROSCOPIC  4/08  . excision of thyroid nodule  2/00  . EYE SURGERY Right 11/2014    Current Outpatient Medications  Medication Sig Dispense Refill  . ALPRAZolam (XANAX) 0.5 MG tablet as needed.  2  . Biotin 10 MG CAPS Take by mouth daily.    . busPIRone (BUSPAR) 10 MG tablet Take 10 mg by mouth 2 (two) times daily.    . Calcium Carbonate (CALCIUM 600 PO) Take by mouth daily.    . Cholecalciferol (D-5000) 5000 UNITS TABS Take by mouth daily.    . Cyanocobalamin (RA VITAMIN B-12 TR)  1000 MCG TBCR Take by mouth daily.    . eszopiclone (LUNESTA) 1 MG TABS tablet Take 1 mg by mouth at bedtime as needed.  1  . ferrous sulfate 325 (65 FE) MG tablet Take by mouth.    . finasteride (PROSCAR) 5 MG tablet Take by mouth.    Marland Kitchen FLUoxetine (PROZAC) 40 MG capsule 40 mg.     . levothyroxine (SYNTHROID) 100 MCG tablet Take 100 mcg by mouth.     . melatonin 1 MG TABS tablet Take 5 mg by mouth at bedtime.    . Multiple Vitamin (MULTIVITAMIN) tablet Take 1 tablet by mouth daily.    . Omega-3 Fatty Acids (FISH OIL PO) Take by mouth.    Marland Kitchen omeprazole (PRILOSEC) 40 MG capsule Take by mouth.    . pravastatin (PRAVACHOL) 20 MG tablet Take by mouth.     No current facility-administered medications for this visit.    Family History  Problem Relation Age of Onset  . Alzheimer's disease Mother 61       died  . CVA Mother   . Heart attack Father   . Kidney cancer Father   .  Hypertension Father   . Hyperlipidemia Father   . Cancer Father        reoccurred 03/19/13/mets to lungs, adrenal gland, and some lymph nodes    Review of Systems  All other systems reviewed and are negative.   Exam:   BP 124/70 (BP Location: Right Arm, Patient Position: Sitting, Cuff Size: Normal)   Pulse 64   Resp 14   Ht 5' 5.5" (1.664 m)   Wt 182 lb (82.6 kg)   LMP 10/17/2014   BMI 29.83 kg/m   Height: 5' 5.5" (166.4 cm)  General appearance: alert, cooperative and appears stated age Head: Normocephalic, without obvious abnormality, atraumatic Neck: no adenopathy, supple, symmetrical, trachea midline and thyroid normal to inspection and palpation Lungs: clear to auscultation bilaterally Breasts: normal appearance, no masses or tenderness Heart: regular rate and rhythm Abdomen: soft, non-tender; bowel sounds normal; no masses,  no organomegaly Extremities: extremities normal, atraumatic, no cyanosis or edema Skin: Skin color, texture, turgor normal. No rashes or lesions Lymph nodes: Cervical, supraclavicular, and axillary nodes normal. No abnormal inguinal nodes palpated Neurologic: Grossly normal   Pelvic: External genitalia:  no lesions              Urethra:  normal appearing urethra with no masses, tenderness or lesions              Bartholins and Skenes: normal                 Vagina: normal appearing vagina with normal color and discharge, no lesions              Cervix: no lesions              Pap taken: Yes.   Bimanual Exam:  Uterus:  enlarged, 8 weeks size, stable in size              Adnexa: normal adnexa and no mass, fullness, tenderness               Rectovaginal: Confirms               Anus:  normal sphincter tone, no lesions  Chaperone, Terence Lux, CMA, was present for exam.  A:  Well Woman with normal exam Postmenopausal, no HRT H/o abnormal pap smears that seem to be related to  menopause as there was never +HR HPV:  H/o ASCUS-H pap 6/12, neg pap  with neg HR HPV 7/14, ASCUS pap with neg HR HPV 11/15, ASCUS with neg HR HPV 12/12/15, neg pap with neg HR HPV 2/18, 2019 ASCUS pap with neg HR HP, 2020 neg pap H/o fibroid uterus that is still mildly enlarged but stabe in size H/o elevated lipids Depression Anxiety  P:   Mammogram guidelines reviewed.  Doing yearly. pap smear with HR HPV obtained today.  reviewed guidelines with pt and that I feel it is completely safe to skip pap this year.  Pt desires, especially with increased anxiety. Colonoscopy due and pt is aware Consider BMD again in 2023 Vaccines reviewed.  Has received Zostavax but declines shingrix at this time. Continue with therapist and medication management  Return annually or prn  47 minutes total spent with pt with >50% of this spent discussing her social stressors, depression and anxiety due to recent death of cat, possibly selling and buying a new home, and the cleaning out of her father's home as well as pt never being married or having children.

## 2020-08-04 ENCOUNTER — Encounter: Payer: Self-pay | Admitting: Obstetrics & Gynecology

## 2020-08-04 ENCOUNTER — Other Ambulatory Visit: Payer: Self-pay

## 2020-08-04 ENCOUNTER — Other Ambulatory Visit (HOSPITAL_COMMUNITY)
Admission: RE | Admit: 2020-08-04 | Discharge: 2020-08-04 | Disposition: A | Discharge status: Home / Self Care | Payer: BC Managed Care – PPO | Source: Ambulatory Visit | Attending: Obstetrics & Gynecology | Admitting: Obstetrics & Gynecology

## 2020-08-04 ENCOUNTER — Ambulatory Visit: Payer: BC Managed Care – PPO | Admitting: Obstetrics & Gynecology

## 2020-08-04 VITALS — BP 124/70 | HR 64 | Resp 14 | Ht 65.5 in | Wt 182.0 lb

## 2020-08-04 DIAGNOSIS — Z124 Encounter for screening for malignant neoplasm of cervix: Secondary | ICD-10-CM

## 2020-08-04 DIAGNOSIS — D250 Submucous leiomyoma of uterus: Secondary | ICD-10-CM | POA: Diagnosis not present

## 2020-08-04 DIAGNOSIS — F419 Anxiety disorder, unspecified: Secondary | ICD-10-CM | POA: Diagnosis not present

## 2020-08-04 DIAGNOSIS — N852 Hypertrophy of uterus: Secondary | ICD-10-CM

## 2020-08-04 DIAGNOSIS — Z01419 Encounter for gynecological examination (general) (routine) without abnormal findings: Secondary | ICD-10-CM | POA: Diagnosis not present

## 2020-08-04 DIAGNOSIS — Z659 Problem related to unspecified psychosocial circumstances: Secondary | ICD-10-CM | POA: Diagnosis not present

## 2020-08-04 DIAGNOSIS — F321 Major depressive disorder, single episode, moderate: Secondary | ICD-10-CM | POA: Diagnosis not present

## 2020-08-05 LAB — CYTOLOGY - PAP: Diagnosis: NEGATIVE

## 2020-08-12 ENCOUNTER — Ambulatory Visit: Payer: BC Managed Care – PPO | Admitting: Obstetrics & Gynecology

## 2020-12-06 IMAGING — MG DIGITAL SCREENING BILAT W/ TOMO W/ CAD
8 series · 8 of 24 positions shown · non-contrast
Comparison: Previous exam(s).

CLINICAL DATA: Screening.

EXAM:
DIGITAL SCREENING BILATERAL MAMMOGRAM WITH TOMO AND CAD

[R MLO synth-2D]
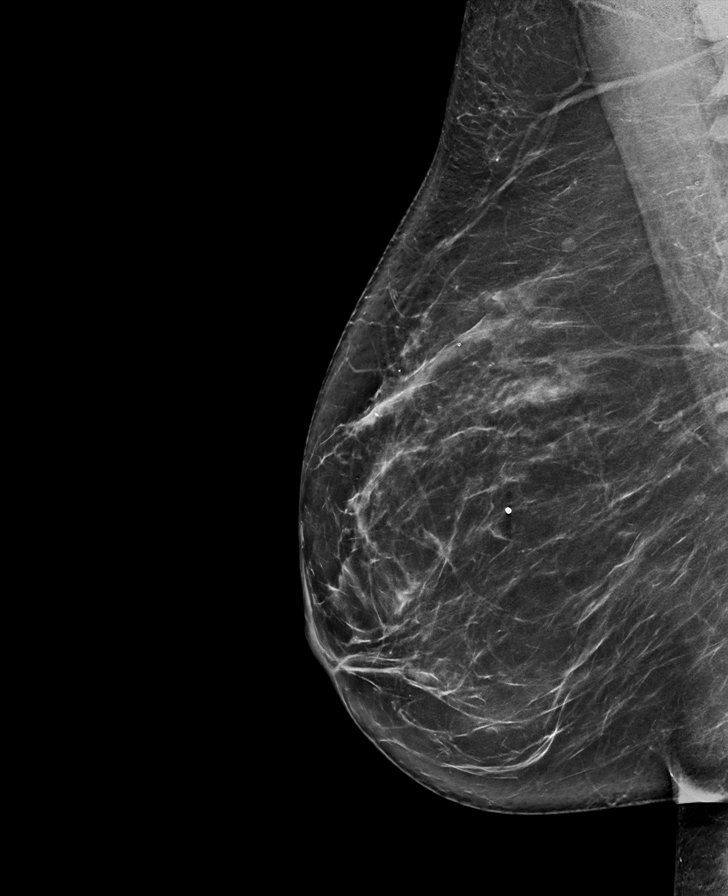

[R CC synth-2D]
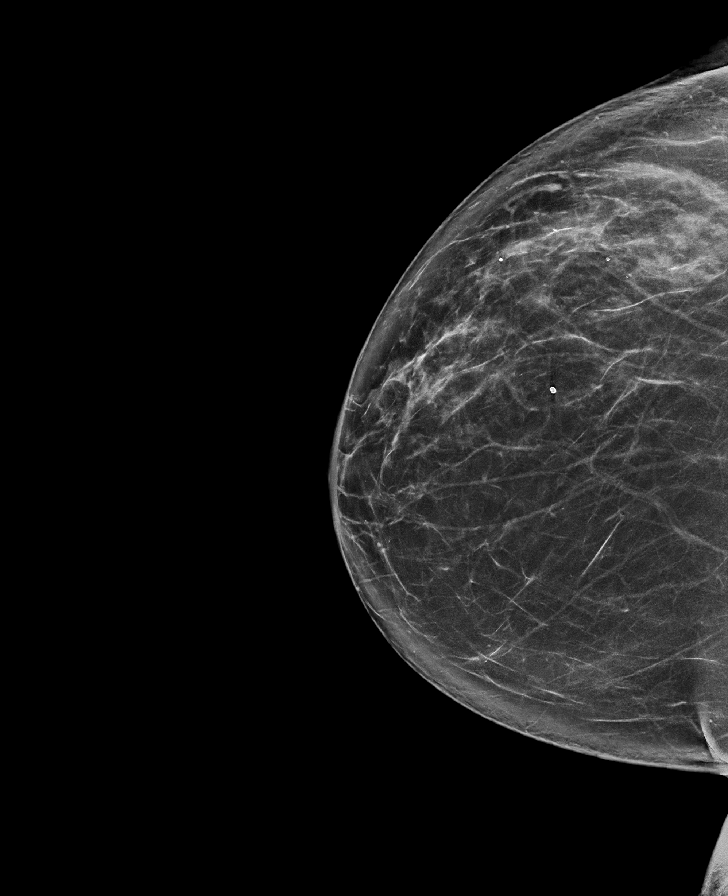

[L MLO synth-2D]
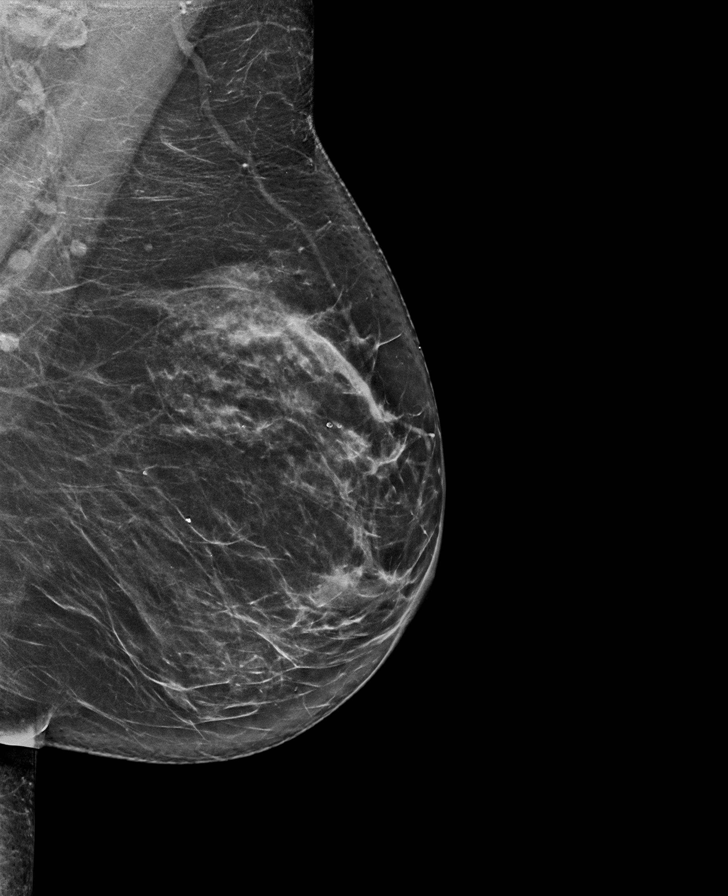

[L CC synth-2D]
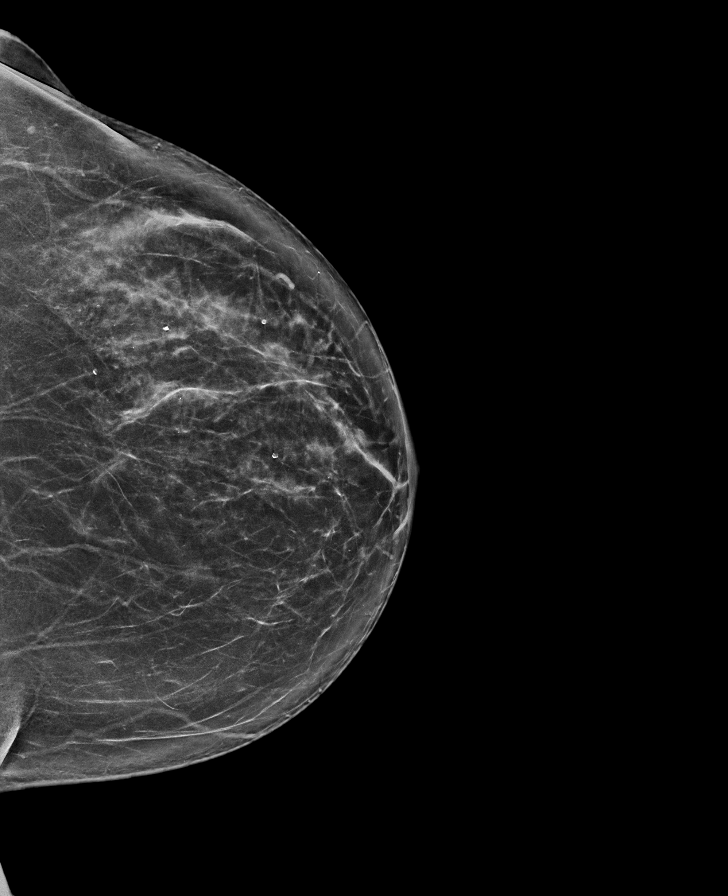

[L CC tomo · tomo slice 41/81.0]
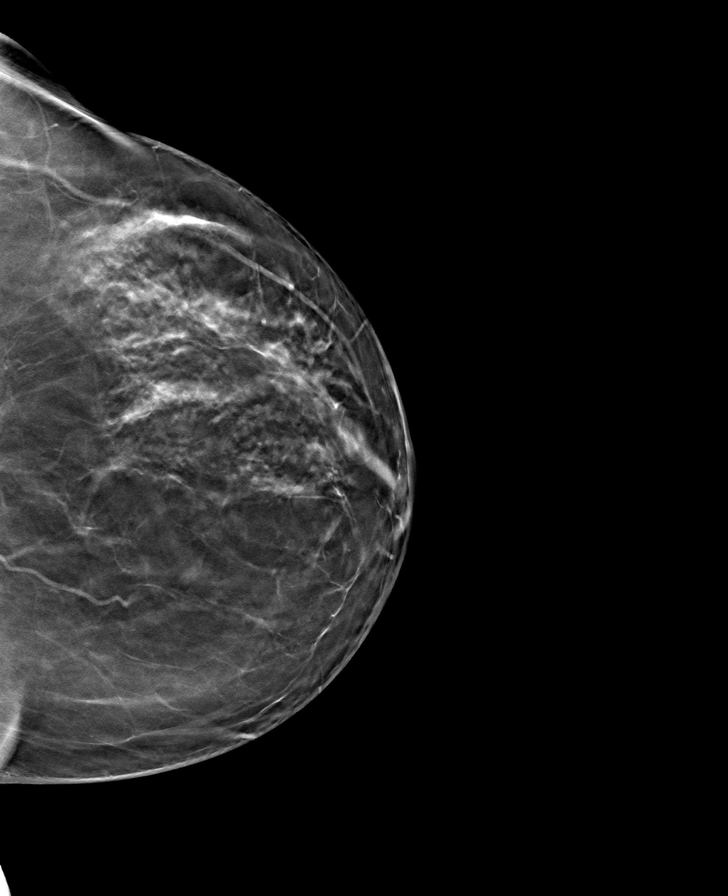

[R CC tomo · tomo slice 40/79.0]
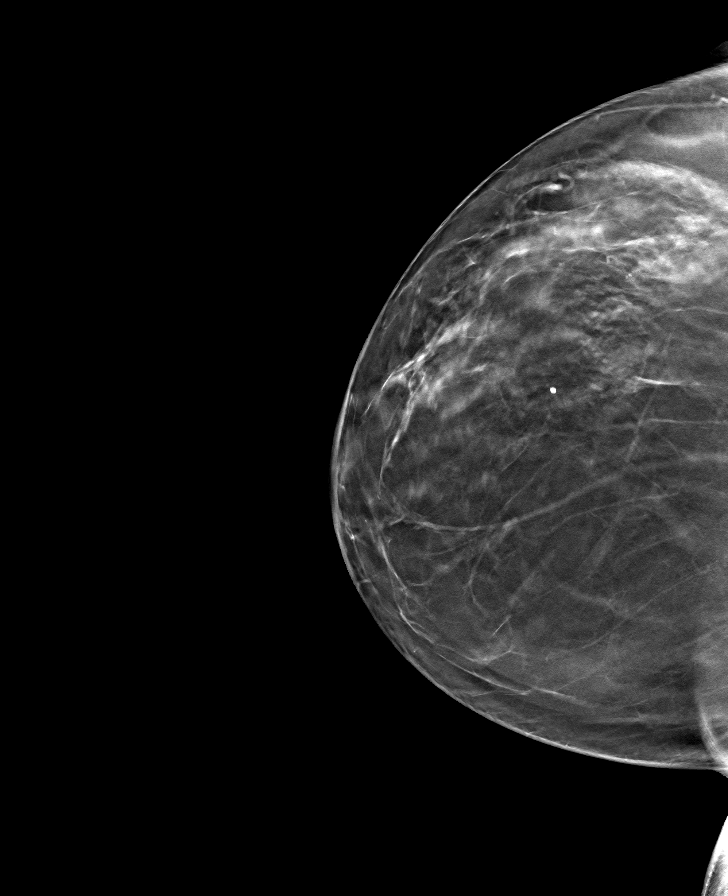

[R MLO tomo · tomo slice 41/82.0]
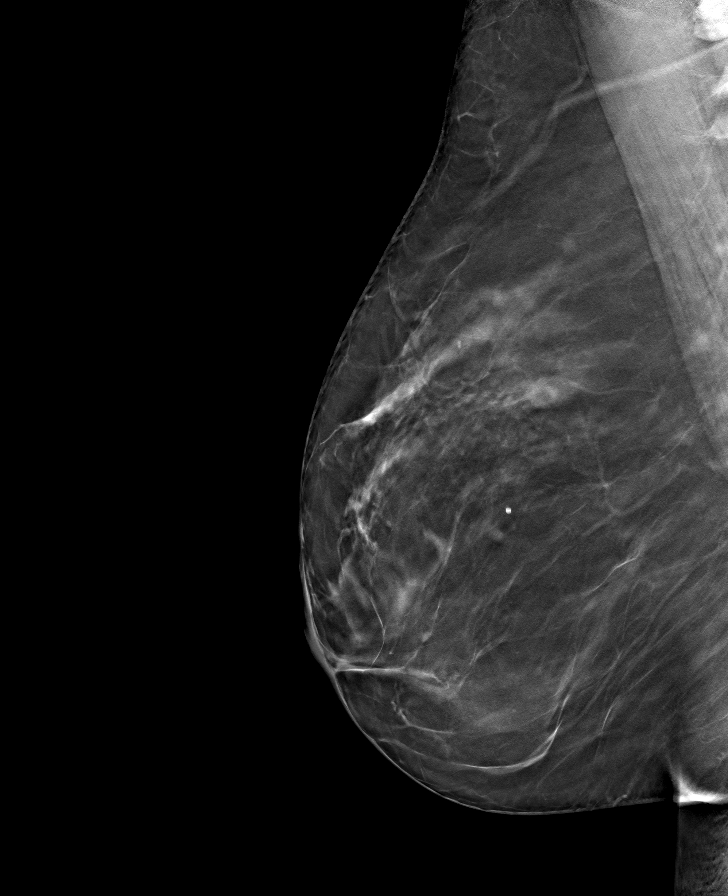

[L MLO tomo · tomo slice 41/80.0]
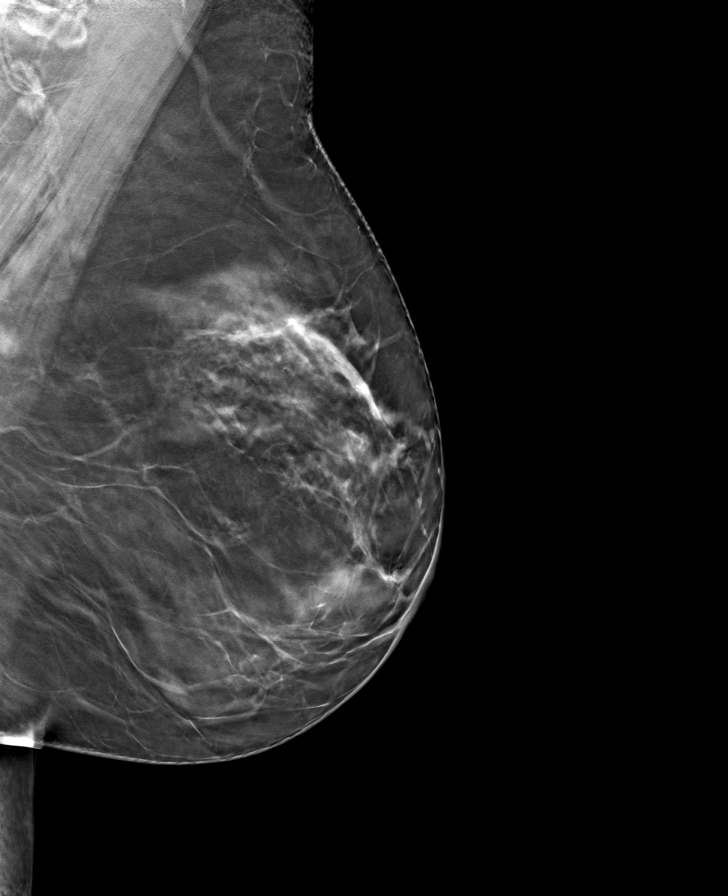

[8 of 24 positions shown; findings below may reference images not displayed]

ACR Breast Density Category b: There are scattered areas of
fibroglandular density.
FINDINGS: There are no findings suspicious for malignancy. Images were
processed with CAD.
IMPRESSION: No mammographic evidence of malignancy. A result letter of this
screening mammogram will be mailed directly to the patient.

RECOMMENDATION:
Screening mammogram in one year. (Code:CN-[id number])

BI-RADS CATEGORY  1: Negative.

## 2021-05-24 ENCOUNTER — Telehealth: Payer: Self-pay

## 2021-05-24 MED ORDER — SUPREP BOWEL PREP KIT 17.5-3.13-1.6 GM/177ML PO SOLN: 1.0000 | ORAL | 0 refills | Status: DC

## 2021-05-24 NOTE — Telephone Encounter (Signed)
Gastroenterology Pre-Procedure Review  Request Date: 07/04/21 Requesting Physician: Dr. Allen Norris  PATIENT REVIEW QUESTIONS: The patient responded to the following health history questions as indicated:    1. Are you having any GI issues? no 2. Do you have a personal history of Polyps? no 3. Do you have a family history of Colon Cancer or Polyps? no 4. Diabetes Mellitus? no 5. Joint replacements in the past 12 months?no 6. Major health problems in the past 3 months?no 7. Any artificial heart valves, MVP, or defibrillator?no    MEDICATIONS & ALLERGIES:    Patient reports the following regarding taking any anticoagulation/antiplatelet therapy:   Plavix, Coumadin, Eliquis, Xarelto, Lovenox, Pradaxa, Brilinta, or Effient? no Aspirin? no  Patient confirms/reports the following medications:  Current Outpatient Medications  Medication Sig Dispense Refill   ALPRAZolam (XANAX) 0.5 MG tablet as needed.  2   Biotin 10 MG CAPS Take by mouth daily.     busPIRone (BUSPAR) 10 MG tablet Take 10 mg by mouth 2 (two) times daily.     Calcium Carbonate (CALCIUM 600 PO) Take by mouth daily.     Cholecalciferol (D-5000) 5000 UNITS TABS Take by mouth daily.     Cyanocobalamin (RA VITAMIN B-12 TR) 1000 MCG TBCR Take by mouth daily.     eszopiclone (LUNESTA) 1 MG TABS tablet Take 1 mg by mouth at bedtime as needed.  1   ferrous sulfate 325 (65 FE) MG tablet Take by mouth.     finasteride (PROSCAR) 5 MG tablet Take by mouth.     FLUoxetine (PROZAC) 40 MG capsule 40 mg.      levothyroxine (SYNTHROID) 100 MCG tablet Take 100 mcg by mouth.      melatonin 1 MG TABS tablet Take 5 mg by mouth at bedtime.     Multiple Vitamin (MULTIVITAMIN) tablet Take 1 tablet by mouth daily.     Omega-3 Fatty Acids (FISH OIL PO) Take by mouth.     omeprazole (PRILOSEC) 40 MG capsule Take by mouth.     pravastatin (PRAVACHOL) 20 MG tablet Take by mouth.     No current facility-administered medications for this visit.    Patient  confirms/reports the following allergies:  Allergies  Allergen Reactions   Codeine Nausea And Vomiting    No orders of the defined types were placed in this encounter.   AUTHORIZATION INFORMATION Primary Insurance: 1D#: Group #:  Secondary Insurance: 1D#: Group #:  SCHEDULE INFORMATION: Date:  Time: Location:

## 2021-05-31 ENCOUNTER — Other Ambulatory Visit: Payer: Self-pay | Admitting: Internal Medicine

## 2021-05-31 DIAGNOSIS — Z1231 Encounter for screening mammogram for malignant neoplasm of breast: Secondary | ICD-10-CM

## 2021-06-13 ENCOUNTER — Other Ambulatory Visit: Payer: Self-pay

## 2021-06-13 ENCOUNTER — Ambulatory Visit
Admission: RE | Admit: 2021-06-13 | Discharge: 2021-06-13 | Disposition: A | Discharge status: Home / Self Care | Payer: BC Managed Care – PPO | Source: Ambulatory Visit | Attending: Internal Medicine | Admitting: Internal Medicine

## 2021-06-13 DIAGNOSIS — Z1231 Encounter for screening mammogram for malignant neoplasm of breast: Secondary | ICD-10-CM | POA: Diagnosis not present

## 2021-06-20 ENCOUNTER — Other Ambulatory Visit: Payer: Self-pay

## 2021-06-20 DIAGNOSIS — Z1211 Encounter for screening for malignant neoplasm of colon: Secondary | ICD-10-CM

## 2021-06-29 ENCOUNTER — Telehealth: Payer: Self-pay

## 2021-06-29 NOTE — Telephone Encounter (Signed)
SPOKE WITH PATIENT SHE IS FEELING DIZZY AND NOT WELL AND WANTED TO RESCHEDULE HER PROCEDURE FROM Tuesday OF NEXT WEEK TO 08/03/2021 WITH DR.WOHL I ALSO SPOKE WITH TRISH AND IT HAS ALL BEEN SET UP FOR 08/03/2021

## 2021-06-29 NOTE — Telephone Encounter (Signed)
CALLED PATIENT TO RESCHEDULE NO ANSWER NO WAY TO LEAVE A VOICEMAIL

## 2021-08-02 ENCOUNTER — Encounter: Payer: Self-pay | Admitting: Gastroenterology

## 2021-08-03 ENCOUNTER — Ambulatory Visit
Admission: RE | Admit: 2021-08-03 | Discharge: 2021-08-03 | Disposition: A | Discharge status: Home / Self Care | Payer: BC Managed Care – PPO | Attending: Gastroenterology | Admitting: Gastroenterology

## 2021-08-03 ENCOUNTER — Encounter
Admission: RE | Disposition: A | Discharge status: Home / Self Care | Payer: Self-pay | Source: Home / Self Care | Attending: Gastroenterology

## 2021-08-03 ENCOUNTER — Ambulatory Visit: Payer: BC Managed Care – PPO | Admitting: Certified Registered"

## 2021-08-03 ENCOUNTER — Encounter: Payer: Self-pay | Admitting: Gastroenterology

## 2021-08-03 ENCOUNTER — Other Ambulatory Visit: Payer: Self-pay

## 2021-08-03 DIAGNOSIS — D123 Benign neoplasm of transverse colon: Secondary | ICD-10-CM | POA: Insufficient documentation

## 2021-08-03 DIAGNOSIS — Z885 Allergy status to narcotic agent status: Secondary | ICD-10-CM | POA: Insufficient documentation

## 2021-08-03 DIAGNOSIS — Z9049 Acquired absence of other specified parts of digestive tract: Secondary | ICD-10-CM | POA: Diagnosis not present

## 2021-08-03 DIAGNOSIS — Z79890 Hormone replacement therapy (postmenopausal): Secondary | ICD-10-CM | POA: Diagnosis not present

## 2021-08-03 DIAGNOSIS — K635 Polyp of colon: Secondary | ICD-10-CM

## 2021-08-03 DIAGNOSIS — Z79899 Other long term (current) drug therapy: Secondary | ICD-10-CM | POA: Insufficient documentation

## 2021-08-03 DIAGNOSIS — K640 First degree hemorrhoids: Secondary | ICD-10-CM | POA: Diagnosis not present

## 2021-08-03 DIAGNOSIS — Z1211 Encounter for screening for malignant neoplasm of colon: Secondary | ICD-10-CM

## 2021-08-03 HISTORY — PX: COLONOSCOPY WITH PROPOFOL: SHX5780

## 2021-08-03 SURGERY — COLONOSCOPY WITH PROPOFOL
Anesthesia: General

## 2021-08-03 MED ORDER — LIDOCAINE HCL (CARDIAC) PF 100 MG/5ML IV SOSY
PREFILLED_SYRINGE | INTRAVENOUS | Status: DC | PRN
  Administered 2021-08-03: 50 mg via INTRAVENOUS

## 2021-08-03 MED ORDER — PROPOFOL 500 MG/50ML IV EMUL
INTRAVENOUS | Status: DC | PRN
  Administered 2021-08-03: 100 ug/kg/min via INTRAVENOUS

## 2021-08-03 MED ORDER — PROPOFOL 500 MG/50ML IV EMUL
INTRAVENOUS | Status: AC
  Filled 2021-08-03: qty 50

## 2021-08-03 MED ORDER — LIDOCAINE HCL (PF) 2 % IJ SOLN
INTRAMUSCULAR | Status: AC
  Filled 2021-08-03: qty 5

## 2021-08-03 MED ORDER — SODIUM CHLORIDE 0.9 % IV SOLN: INTRAVENOUS | Status: DC

## 2021-08-03 MED ORDER — PROPOFOL 10 MG/ML IV BOLUS
INTRAVENOUS | Status: DC | PRN
  Administered 2021-08-03: 20 mg via INTRAVENOUS
  Administered 2021-08-03: 80 mg via INTRAVENOUS

## 2021-08-03 NOTE — H&P (Signed)
Lucilla Lame, MD [address] [place], [place] [address] Phone: [phone] Fax : [phone]  Primary Care Physician:  Idelle Crouch, MD Primary Gastroenterologist:  Dr. Allen Norris  Pre-Procedure History & Physical: HPI:  Sharon Hicks is a 60 y.o. female is here for a screening colonoscopy.   Past Medical History:  Diagnosis Date   Abnormal Pap smear    h/o ascus-h pap, colpo negative   Anemia    h/o   Anxiety    stressors with fahter aging   Elevated lipids    Fibroids    Hearing loss in left ear    Hypothyroidism    Obesity     Past Surgical History:  Procedure Laterality Date   APPENDECTOMY  02/2007   cardiac evaluation  02/2006   stress test, echo, EKG-mild MVP   CHOLECYSTECTOMY, LAPAROSCOPIC  02/2007   excision of thyroid nodule  12/1998   EYE SURGERY Right 11/2014    Prior to Admission medications   Medication Sig Start Date End Date Taking? Authorizing Provider  ALPRAZolam Duanne Moron) 0.5 MG tablet as needed. 03/17/15  Yes [provider]  Biotin 10 MG CAPS Take by mouth daily.   Yes [provider]  busPIRone (BUSPAR) 10 MG tablet Take 10 mg by mouth 2 (two) times daily. 08/03/20  Yes [provider]  Calcium Carbonate (CALCIUM 600 PO) Take by mouth daily.   Yes [provider]  Cholecalciferol 125 MCG (5000 UT) TABS Take by mouth daily.   Yes [provider]  Cyanocobalamin 1000 MCG TBCR Take by mouth daily.   Yes [provider]  eszopiclone (LUNESTA) 1 MG TABS tablet Take 1 mg by mouth at bedtime as needed. 06/20/18  Yes [provider]  finasteride (PROSCAR) 5 MG tablet Take by mouth. 02/03/20  Yes [provider]  FLUoxetine (PROZAC) 40 MG capsule 40 mg.  01/15/18  Yes [provider]  levothyroxine (SYNTHROID) 100 MCG tablet Take 100 mcg by mouth.  03/13/17 08/03/21 Yes [provider]  melatonin 1 MG TABS tablet Take 5 mg by mouth at bedtime.   Yes [provider]  Multiple Vitamin (MULTIVITAMIN) tablet Take 1 tablet by mouth daily.   Yes [provider]  Omega-3 Fatty Acids (FISH OIL PO) Take by mouth.   Yes [provider]  omeprazole (PRILOSEC) 40 MG capsule Take by mouth. 06/27/16 08/03/21 Yes [provider]  pravastatin (PRAVACHOL) 20 MG tablet Take by mouth. 06/27/16 08/03/21 Yes [provider]  ferrous sulfate 325 (65 FE) MG tablet Take by mouth. 09/19/18 08/04/20  [provider]  Na Sulfate-K Sulfate-Mg Sulf (SUPREP BOWEL PREP KIT) 17.5-3.13-1.6 GM/177ML SOLN Take 1 kit by mouth as directed. 05/24/21   Lucilla Lame, MD    Allergies as of 06/20/2021 - Review Complete 08/04/2020  Allergen Reaction Noted   Codeine Nausea And Vomiting 02/17/2013    Family History  Problem Relation Age of Onset   Alzheimer's disease Mother 51       died   CVA Mother    Heart attack Father    Kidney cancer Father    Hypertension Father    Hyperlipidemia Father    Cancer Father        reoccurred 03/19/13/mets to lungs, adrenal gland, and some lymph nodes    Social History   Socioeconomic History   Marital status: Single    Spouse name: Not on file   Number of children: Not on file  Years of education: Not on file   Highest education level: Not on file  Occupational History   Not on file  Tobacco Use   Smoking status: Never   Smokeless tobacco: Never  Vaping Use   Vaping Use: Never used  Substance and Sexual Activity   Alcohol use: Not Currently    Comment: rarely   Drug use: No   Sexual activity: Not Currently    Partners: Male    Birth control/protection: Abstinence, Post-menopausal  Other Topics Concern   Not on file  Social History Narrative   Not on file   Social Determinants of Health   Financial Resource Strain: Not on file  Food Insecurity: Not on file  Transportation Needs: Not on file  Physical Activity: Not on file  Stress: Not on file  Social Connections: Not on file   Intimate Partner Violence: Not on file    Review of Systems: See HPI, otherwise negative ROS  Physical Exam: BP 138/77   Pulse 62   Temp 97.9 F (36.6 C) (Temporal)   Resp 18   Ht _0  (1.676 m)   Wt 88.5 kg   LMP 10/17/2014   SpO2 100%   BMI 31.47 kg/m  General:   Alert,  pleasant and cooperative in NAD Head:  Normocephalic and atraumatic. Neck:  Supple; no masses or thyromegaly. Lungs:  Clear throughout to auscultation.    Heart:  Regular rate and rhythm. Abdomen:  Soft, nontender and nondistended. Normal bowel sounds, without guarding, and without rebound.   Neurologic:  Alert and  oriented x4;  grossly normal neurologically.  Impression/Plan: Sharon Hicks is now here to undergo a screening colonoscopy.  Risks, benefits, and alternatives regarding colonoscopy have been reviewed with the patient.  Questions have been answered.  All parties agreeable.

## 2021-08-03 NOTE — Transfer of Care (Signed)
Immediate Anesthesia Transfer of Care Note  Patient: Sharon Hicks  Procedure(s) Performed: COLONOSCOPY WITH PROPOFOL  Patient Location: PACU  Anesthesia Type:MAC  Level of Consciousness: awake, alert  and oriented  Airway & Oxygen Therapy: Patient Spontanous Breathing  Post-op Assessment: Report given to RN and Post -op Vital signs reviewed and stable  Post vital signs: stable  Last Vitals:  Vitals Value Taken Time  BP    Temp    Pulse 60 08/03/21 1101  Resp 12 08/03/21 1101  SpO2 99 % 08/03/21 1101  Vitals shown include unvalidated device data.  Last Pain:  Vitals:   08/03/21 0955  TempSrc: Temporal  PainSc: 0-No pain         Complications: No notable events documented.

## 2021-08-03 NOTE — Op Note (Signed)
[facility] Gastroenterology Patient Name: Sharon Hicks Procedure Date: 08/03/2021 10:36 AM MRN: [id number] Account #: [id number] Date of Birth: 10-04-1961 Admit Type: Outpatient Age: 60 Room: [facility] ENDO ROOM 4 Gender: Female Note Status: Finalized Instrument Name: Jasper Riling [id number] Procedure:             Colonoscopy Indications:           Screening for colorectal malignant neoplasm Providers:             Lucilla Lame MD, MD Referring MD:          Leonie Douglas. Doy Hutching, MD (Referring MD) Medicines:             Propofol per Anesthesia Complications:         No immediate complications. Procedure:             Pre-Anesthesia Assessment:                        - Prior to the procedure, a History and Physical was                         performed, and patient medications and allergies were                         reviewed. The patient's tolerance of previous                         anesthesia was also reviewed. The risks and benefits                         of the procedure and the sedation options and risks                         were discussed with the patient. All questions were                         answered, and informed consent was obtained. Prior                         Anticoagulants: The patient has taken no previous                         anticoagulant or antiplatelet agents. ASA Grade                         Assessment: II - A patient with mild systemic disease.                         After reviewing the risks and benefits, the patient                         was deemed in satisfactory condition to undergo the                         procedure.                        After obtaining informed consent, the colonoscope was  passed under direct vision. Throughout the procedure,                         the patient's blood pressure, pulse, and oxygen                         saturations were monitored continuously. The                          Colonoscope was introduced through the anus and                         advanced to the the cecum, identified by appendiceal                         orifice and ileocecal valve. The colonoscopy was                         performed without difficulty. The patient tolerated                         the procedure well. The quality of the bowel                         preparation was excellent. Findings:      The perianal and digital rectal examinations were normal.      A 4 mm polyp was found in the transverse colon. The polyp was sessile.       The polyp was removed with a cold snare. Resection and retrieval were       complete.      Non-bleeding internal hemorrhoids were found during retroflexion. The       hemorrhoids were Grade I (internal hemorrhoids that do not prolapse). Impression:            - One 4 mm polyp in the transverse colon, removed with                         a cold snare. Resected and retrieved.                        - Non-bleeding internal hemorrhoids. Recommendation:        - Discharge patient to home.                        - Resume previous diet.                        - Continue present medications.                        - Await pathology results.                        - If the pathology report reveals adenomatous tissue,                         then repeat the colonoscopy for surveillance in 7                         years  otherwise 10 years. Procedure Code(s):     --- Professional ---                        [phone], Colonoscopy, flexible; with removal of                         tumor(s), polyp(s), or other lesion(s) by snare                         technique Diagnosis Code(s):     --- Professional ---                        Z12.11, Encounter for screening for malignant neoplasm                         of colon                        K63.5, Polyp of colon CPT copyright 2019 American Medical Association. All rights reserved. The codes documented in this report  are preliminary and upon coder review may  be revised to meet current compliance requirements. Lucilla Lame MD, MD 08/03/2021 10:59:31 AM This report has been signed electronically. Number of Addenda: 0 Note Initiated On: 08/03/2021 10:36 AM Scope Withdrawal Time: 0 hours 8 minutes 13 seconds  Total Procedure Duration: 0 hours 11 minutes 31 seconds  Estimated Blood Loss:  Estimated blood loss: none.      [facility]

## 2021-08-03 NOTE — Anesthesia Postprocedure Evaluation (Signed)
Anesthesia Post Note  Patient: Sharon Hicks  Procedure(s) Performed: COLONOSCOPY WITH PROPOFOL  Patient location during evaluation: PACU Anesthesia Type: General Level of consciousness: awake and alert Pain management: pain level controlled Vital Signs Assessment: post-procedure vital signs reviewed and stable Respiratory status: spontaneous breathing, nonlabored ventilation, respiratory function stable and patient connected to nasal cannula oxygen Cardiovascular status: blood pressure returned to baseline and stable Postop Assessment: no apparent nausea or vomiting Anesthetic complications: no   No notable events documented.   Last Vitals:  Vitals:   08/03/21 0955 08/03/21 1101  BP: 138/77 109/65  Pulse: 62 60  Resp: 18 17  Temp: 36.6 C (!) 36.4 C  SpO2: 100% (!) 20%    Last Pain:  Vitals:   08/03/21 1121  TempSrc:   PainSc: 0-No pain                 [place]

## 2021-08-03 NOTE — Anesthesia Preprocedure Evaluation (Addendum)
Anesthesia Evaluation  Patient identified by MRN, date of birth, ID band Patient awake    Reviewed: Allergy & Precautions, NPO status , Patient's Chart, lab work & pertinent test results  History of Anesthesia Complications Negative for: history of anesthetic complications  Airway Mallampati: II  TM Distance: >3 FB Neck ROM: Full    Dental   Pulmonary neg pulmonary ROS,    Pulmonary exam normal        Cardiovascular Normal cardiovascular exam  HLD   Neuro/Psych Anxiety Depression negative neurological ROS     GI/Hepatic negative GI ROS, Neg liver ROS,   Endo/Other  Hypothyroidism   Renal/GU negative Renal ROS  negative genitourinary   Musculoskeletal negative musculoskeletal ROS (+)   Abdominal   Peds negative pediatric ROS (+)  Hematology  (+) anemia ,   Anesthesia Other Findings   Reproductive/Obstetrics negative OB ROS                            Anesthesia Physical Anesthesia Plan  ASA: 2  Anesthesia Plan: General   Post-op Pain Management:    Induction:   PONV Risk Score and Plan:   Airway Management Planned: Natural Airway  Additional Equipment:   Intra-op Plan:   Post-operative Plan:   Informed Consent: I have reviewed the patients History and Physical, chart, labs and discussed the procedure including the risks, benefits and alternatives for the proposed anesthesia with the patient or authorized representative who has indicated his/her understanding and acceptance.       Plan Discussed with: CRNA  Anesthesia Plan Comments:         Anesthesia Quick Evaluation

## 2021-08-04 ENCOUNTER — Encounter: Payer: Self-pay | Admitting: Gastroenterology

## 2021-08-04 LAB — SURGICAL PATHOLOGY

## 2021-08-07 ENCOUNTER — Ambulatory Visit (HOSPITAL_BASED_OUTPATIENT_CLINIC_OR_DEPARTMENT_OTHER): Payer: BC Managed Care – PPO | Admitting: Obstetrics & Gynecology

## 2021-09-06 ENCOUNTER — Ambulatory Visit (HOSPITAL_BASED_OUTPATIENT_CLINIC_OR_DEPARTMENT_OTHER): Payer: BC Managed Care – PPO | Admitting: Obstetrics & Gynecology

## 2021-10-04 ENCOUNTER — Encounter (HOSPITAL_BASED_OUTPATIENT_CLINIC_OR_DEPARTMENT_OTHER): Payer: Self-pay | Admitting: Obstetrics & Gynecology

## 2021-10-04 ENCOUNTER — Other Ambulatory Visit (HOSPITAL_COMMUNITY)
Admission: RE | Admit: 2021-10-04 | Discharge: 2021-10-04 | Disposition: A | Discharge status: Home / Self Care | Payer: BC Managed Care – PPO | Source: Ambulatory Visit | Attending: Obstetrics & Gynecology | Admitting: Obstetrics & Gynecology

## 2021-10-04 ENCOUNTER — Ambulatory Visit (INDEPENDENT_AMBULATORY_CARE_PROVIDER_SITE_OTHER): Payer: BC Managed Care – PPO | Admitting: Obstetrics & Gynecology

## 2021-10-04 ENCOUNTER — Other Ambulatory Visit: Payer: Self-pay

## 2021-10-04 VITALS — BP 141/80 | HR 58 | Ht 66.0 in | Wt 206.0 lb

## 2021-10-04 DIAGNOSIS — Z124 Encounter for screening for malignant neoplasm of cervix: Secondary | ICD-10-CM | POA: Insufficient documentation

## 2021-10-04 DIAGNOSIS — Z8659 Personal history of other mental and behavioral disorders: Secondary | ICD-10-CM

## 2021-10-04 DIAGNOSIS — N3946 Mixed incontinence: Secondary | ICD-10-CM

## 2021-10-04 DIAGNOSIS — K635 Polyp of colon: Secondary | ICD-10-CM

## 2021-10-04 DIAGNOSIS — Z01419 Encounter for gynecological examination (general) (routine) without abnormal findings: Secondary | ICD-10-CM | POA: Diagnosis not present

## 2021-10-04 DIAGNOSIS — Z8742 Personal history of other diseases of the female genital tract: Secondary | ICD-10-CM

## 2021-10-04 DIAGNOSIS — E2839 Other primary ovarian failure: Secondary | ICD-10-CM

## 2021-10-04 DIAGNOSIS — Z780 Asymptomatic menopausal state: Secondary | ICD-10-CM | POA: Diagnosis not present

## 2021-10-04 DIAGNOSIS — D251 Intramural leiomyoma of uterus: Secondary | ICD-10-CM

## 2021-10-04 NOTE — Progress Notes (Signed)
60 y.o. G0P0000 Single White or Caucasian female here for annual exam.  Reports she had a Zostavax vaccination last year.  Was given two injections.  This ended up being a Covid booster.  She contacted her physician who treated her with post exposure prophylaxis for 28 days.  Has had testing for HIV and hepatitis for the last year.  Has one more test to go.   Denies vaginal bleeding.     Patient's last menstrual period was 10/17/2014.          Sexually active: No.  The current method of family planning is post menopausal status.     The pregnancy intention screening data noted above was reviewed. Potential methods of contraception were discussed. The patient elected to proceed with No data recorded.  Exercising: Yes.    Smoker:  no  Health Maintenance: Pap:  08/04/2020 Negative History of abnormal Pap:  yes MMG:  06/13/2021 Negative Colonoscopy:  08/03/2021, adenoma, follow up 7 years recommended BMD:   ordered Screening Labs: Dr. Doy Hutching   reports that she has never smoked. She has never used smokeless tobacco. She reports that she does not currently use alcohol. She reports that she does not use drugs.  Past Medical History:  Diagnosis Date   Abnormal Pap smear    h/o ascus-h pap, colpo negative   Anemia    h/o   Anxiety    stressors with fahter aging   Elevated lipids    Fibroids    Hearing loss in left ear    Hypothyroidism    Obesity     Past Surgical History:  Procedure Laterality Date   APPENDECTOMY  02/2007   cardiac evaluation  02/2006   stress test, echo, EKG-mild MVP   CHOLECYSTECTOMY, LAPAROSCOPIC  02/2007   COLONOSCOPY WITH PROPOFOL N/A 08/03/2021   Procedure: COLONOSCOPY WITH PROPOFOL;  Surgeon: Lucilla Lame, MD;  Location: [facility] ENDOSCOPY;  Service: Endoscopy;  Laterality: N/A;   excision of thyroid nodule  12/1998   EYE SURGERY Right 11/2014    Current Outpatient Medications  Medication Sig Dispense Refill   Biotin 10 MG CAPS Take by mouth daily.      busPIRone (BUSPAR) 10 MG tablet Take 10 mg by mouth 2 (two) times daily.     Calcium Carbonate (CALCIUM 600 PO) Take by mouth daily.     Cholecalciferol 125 MCG (5000 UT) TABS Take by mouth daily.     Cyanocobalamin 1000 MCG TBCR Take by mouth daily.     finasteride (PROSCAR) 5 MG tablet Take by mouth.     FLUoxetine (PROZAC) 40 MG capsule 40 mg.      melatonin 1 MG TABS tablet Take 5 mg by mouth at bedtime.     Multiple Vitamin (MULTIVITAMIN) tablet Take 1 tablet by mouth daily.     Omega-3 Fatty Acids (FISH OIL PO) Take by mouth.     ALPRAZolam (XANAX) 0.5 MG tablet as needed. (Patient not taking: Reported on 10/04/2021)  2   eszopiclone (LUNESTA) 1 MG TABS tablet Take 1 mg by mouth at bedtime as needed. (Patient not taking: Reported on 10/04/2021)  1   ferrous sulfate 325 (65 FE) MG tablet Take by mouth.     levothyroxine (SYNTHROID) 100 MCG tablet Take 100 mcg by mouth.      Na Sulfate-K Sulfate-Mg Sulf (SUPREP BOWEL PREP KIT) 17.5-3.13-1.6 GM/177ML SOLN Take 1 kit by mouth as directed. (Patient not taking: Reported on 10/04/2021) 354 mL 0   omeprazole (PRILOSEC) 40 MG  capsule Take by mouth.     pravastatin (PRAVACHOL) 20 MG tablet Take by mouth.     No current facility-administered medications for this visit.    Family History  Problem Relation Age of Onset   Alzheimer's disease Mother 101       died   CVA Mother    Heart attack Father    Kidney cancer Father    Hypertension Father    Hyperlipidemia Father    Cancer Father        reoccurred 03/19/13/mets to lungs, adrenal gland, and some lymph nodes    Review of Systems  All other systems reviewed and are negative.  Exam:   BP (!) 141/80 (BP Location: Right Arm, Patient Position: Sitting, Cuff Size: Large)   Pulse (!) 58   Ht '5\' 6"'  (1.676 m)   Wt 206 lb (93.4 kg)   LMP 10/17/2014   BMI 33.25 kg/m   Height: '5\' 6"'  (167.6 cm)  General appearance: alert, cooperative and appears stated age Head: Normocephalic, without  obvious abnormality, atraumatic Neck: no adenopathy, supple, symmetrical, trachea midline and thyroid normal to inspection and palpation Lungs: clear to auscultation bilaterally Breasts: normal appearance, no masses or tenderness Heart: regular rate and rhythm Abdomen: soft, non-tender; bowel sounds normal; no masses,  no organomegaly Extremities: extremities normal, atraumatic, no cyanosis or edema Skin: Skin color, texture, turgor normal. No rashes or lesions Lymph nodes: Cervical, supraclavicular, and axillary nodes normal. No abnormal inguinal nodes palpated Neurologic: Grossly normal   Pelvic: External genitalia:  no lesions              Urethra:  normal appearing urethra with no masses, tenderness or lesions              Bartholins and Skenes: normal                 Vagina: normal appearing vagina with normal color and no discharge, no lesions              Cervix: no lesions              Pap taken: No. Bimanual Exam:  Uterus:  about 10 weeks size and globular, mobile              Adnexa: normal adnexa and no mass, fullness, tenderness               Rectovaginal: Confirms               Anus:  normal sphincter tone, no lesions  Chaperone, Octaviano Batty, CMA, was present for exam.  Assessment/Plan: 1. Well woman exam with routine gynecological exam - pap obtained today - MMG 06/2021 - colonoscopy 07/2021, follow up 7 years - BMD ordered - lab work done with Dr. Doy Hutching - care gaps reviewed/vaccines updated  2. History of abnormal cervical Pap smear  3. Hypoestrogenism - DG BONE DENSITY (DXA); Future  4. Postmenopausal - no HRT  5. Mixed incontinence urge and stress  6. Intramural leiomyoma of uterus - stable exam today

## 2021-10-11 LAB — CYTOLOGY - PAP
Comment: NEGATIVE
Diagnosis: UNDETERMINED — AB
High risk HPV: NEGATIVE

## 2022-03-23 ENCOUNTER — Other Ambulatory Visit: Payer: Self-pay | Admitting: Internal Medicine

## 2022-03-23 DIAGNOSIS — Z1231 Encounter for screening mammogram for malignant neoplasm of breast: Secondary | ICD-10-CM

## 2022-06-19 ENCOUNTER — Ambulatory Visit
Admission: RE | Admit: 2022-06-19 | Discharge: 2022-06-19 | Disposition: A | Discharge status: Home / Self Care | Payer: BC Managed Care – PPO | Source: Ambulatory Visit | Attending: Internal Medicine | Admitting: Internal Medicine

## 2022-06-19 DIAGNOSIS — Z1231 Encounter for screening mammogram for malignant neoplasm of breast: Secondary | ICD-10-CM | POA: Insufficient documentation

## 2022-10-02 ENCOUNTER — Other Ambulatory Visit (HOSPITAL_BASED_OUTPATIENT_CLINIC_OR_DEPARTMENT_OTHER): Payer: Self-pay | Admitting: Obstetrics & Gynecology

## 2022-10-15 ENCOUNTER — Ambulatory Visit (INDEPENDENT_AMBULATORY_CARE_PROVIDER_SITE_OTHER): Payer: BC Managed Care – PPO | Admitting: Obstetrics & Gynecology

## 2022-10-15 ENCOUNTER — Other Ambulatory Visit (HOSPITAL_COMMUNITY)
Admission: RE | Admit: 2022-10-15 | Discharge: 2022-10-15 | Disposition: A | Discharge status: Home / Self Care | Payer: BC Managed Care – PPO | Source: Ambulatory Visit | Attending: Obstetrics & Gynecology | Admitting: Obstetrics & Gynecology

## 2022-10-15 ENCOUNTER — Encounter (HOSPITAL_BASED_OUTPATIENT_CLINIC_OR_DEPARTMENT_OTHER): Payer: Self-pay | Admitting: Obstetrics & Gynecology

## 2022-10-15 VITALS — BP 123/61 | HR 61 | Ht 66.0 in | Wt 206.6 lb

## 2022-10-15 DIAGNOSIS — M8580 Other specified disorders of bone density and structure, unspecified site: Secondary | ICD-10-CM

## 2022-10-15 DIAGNOSIS — Z01419 Encounter for gynecological examination (general) (routine) without abnormal findings: Secondary | ICD-10-CM

## 2022-10-15 DIAGNOSIS — Z124 Encounter for screening for malignant neoplasm of cervix: Secondary | ICD-10-CM | POA: Diagnosis present

## 2022-10-15 DIAGNOSIS — Z13820 Encounter for screening for osteoporosis: Secondary | ICD-10-CM | POA: Diagnosis not present

## 2022-10-15 DIAGNOSIS — D251 Intramural leiomyoma of uterus: Secondary | ICD-10-CM

## 2022-10-15 NOTE — Patient Instructions (Addendum)
[facility] [address], [place], [place] [address]  [phone]

## 2022-10-15 NOTE — Progress Notes (Signed)
61 y.o. G0P0000 Single White or Caucasian female here for annual exam.  Desires to have yearly pap smear today.  Typically desires this yearly.  Denies vaginal bleeding.  ASCUS with neg HR HPV 1 year.    Getting ready to have a stress test.  Having some intermittent chest pain.    Patient's last menstrual period was 10/12/2014.          Sexually active: No.  The current method of family planning is post menopausal status.    Smoker:  no  Health Maintenance: Pap:  10/04/2021 ASC-US History of abnormal Pap:  h/o ASCUS pap MMG:  06/19/2022 Negative Colonoscopy:  08/03/2021, follow up 7 years BMD:   04/03/2022, osteopenia Screening Labs: done with Dr. Doy Hutching.  HbA1C was a tad elevated.   reports that she has never smoked. She has never used smokeless tobacco. She reports that she does not currently use alcohol. She reports that she does not use drugs.  Past Medical History:  Diagnosis Date   Abnormal Pap smear    h/o ascus-h pap, colpo negative   Anemia    h/o   Anxiety    stressors with fahter aging   Elevated lipids    Fibroids    Hearing loss in left ear    Hypothyroidism    Obesity     Past Surgical History:  Procedure Laterality Date   APPENDECTOMY  02/2007   cardiac evaluation  02/2006   stress test, echo, EKG-mild MVP   CHOLECYSTECTOMY, LAPAROSCOPIC  02/2007   COLONOSCOPY WITH PROPOFOL N/A 08/03/2021   Procedure: COLONOSCOPY WITH PROPOFOL;  Surgeon: Lucilla Lame, MD;  Location: [facility] ENDOSCOPY;  Service: Endoscopy;  Laterality: N/A;   excision of thyroid nodule  12/1998   EYE SURGERY Right 11/2014    Current Outpatient Medications  Medication Sig Dispense Refill   Biotin 10 MG CAPS Take by mouth daily.     Calcium Carbonate (CALCIUM 600 PO) Take by mouth daily.     Cholecalciferol 125 MCG (5000 UT) TABS Take by mouth daily.     Cyanocobalamin 1000 MCG TBCR Take by mouth daily.     finasteride (PROSCAR) 5 MG tablet Take by mouth.     FLUoxetine (PROZAC) 40 MG  capsule 40 mg.      melatonin 1 MG TABS tablet Take 5 mg by mouth at bedtime.     Multiple Vitamin (MULTIVITAMIN) tablet Take 1 tablet by mouth daily.     Omega-3 Fatty Acids (FISH OIL PO) Take by mouth.     ferrous sulfate 325 (65 FE) MG tablet Take by mouth.     levothyroxine (SYNTHROID) 100 MCG tablet Take 100 mcg by mouth.      omeprazole (PRILOSEC) 40 MG capsule Take by mouth.     pravastatin (PRAVACHOL) 20 MG tablet Take by mouth.     No current facility-administered medications for this visit.    Family History  Problem Relation Age of Onset   Alzheimer's disease Mother 51       died   CVA Mother    Heart attack Father    Kidney cancer Father    Hypertension Father    Hyperlipidemia Father    Cancer Father        reoccurred 03/19/13/mets to lungs, adrenal gland, and some lymph nodes    ROS: Constitutional: negative Genitourinary:negative  Exam:   BP 123/61 (BP Location: Right Arm, Patient Position: Sitting, Cuff Size: Large)   Pulse 61   Ht '5\' 6"'$  (1.676  m) Comment: Reported  Wt 206 lb 9.6 oz (93.7 kg)   LMP 10/12/2014   BMI 33.35 kg/m   Height: '5\' 6"'$  (167.6 cm) (Reported)  General appearance: alert, cooperative and appears stated age Head: Normocephalic, without obvious abnormality, atraumatic Neck: no adenopathy, supple, symmetrical, trachea midline and thyroid normal to inspection and palpation Lungs: clear to auscultation bilaterally Breasts: normal appearance, no masses or tenderness Heart: regular rate and rhythm Abdomen: soft, non-tender; bowel sounds normal; no masses,  no organomegaly Extremities: extremities normal, atraumatic, no cyanosis or edema Skin: Skin color, texture, turgor normal. No rashes or lesions Lymph nodes: Cervical, supraclavicular, and axillary nodes normal. No abnormal inguinal nodes palpated Neurologic: Grossly normal   Pelvic: External genitalia:  no lesions              Urethra:  normal appearing urethra with no masses,  tenderness or lesions              Bartholins and Skenes: normal                 Vagina: normal appearing vagina with normal color and no discharge, no lesions              Cervix: no lesions              Pap taken: Yes.   Bimanual Exam:  Uterus:   about 10 weeks size, globular, mobile,               Adnexa: normal adnexa and no mass, fullness, tenderness               Rectovaginal: Confirms               Anus:  normal sphincter tone, no lesions  Chaperone, Octaviano Batty, CMA, was present for exam.  Assessment/Plan: 1. Well woman exam with routine gynecological exam - Pap smear obtained today per pt request - Mammogram 06/2022 - Colonoscopy 2022, follow up 7 years - Bone mineral density done this year - lab work done with with work - vaccines reviewed/updated.  Pt is going to update her Tdap with work  2. Intramural leiomyoma of uterus  3. Cervical cancer screening - Cytology - PAP( [place])  4. Osteopenia

## 2022-10-19 LAB — CYTOLOGY - PAP
Comment: NEGATIVE
Diagnosis: UNDETERMINED — AB
High risk HPV: NEGATIVE

## 2023-03-29 ENCOUNTER — Other Ambulatory Visit: Payer: Self-pay | Admitting: Internal Medicine

## 2023-03-29 DIAGNOSIS — R432 Parageusia: Secondary | ICD-10-CM

## 2023-03-29 DIAGNOSIS — R413 Other amnesia: Secondary | ICD-10-CM

## 2023-04-10 ENCOUNTER — Ambulatory Visit: Payer: BC Managed Care – PPO

## 2023-04-22 ENCOUNTER — Ambulatory Visit
Admission: RE | Admit: 2023-04-22 | Discharge: 2023-04-22 | Disposition: A | Discharge status: Home / Self Care | Payer: BC Managed Care – PPO | Source: Ambulatory Visit | Attending: Internal Medicine | Admitting: Internal Medicine

## 2023-04-22 DIAGNOSIS — R432 Parageusia: Secondary | ICD-10-CM | POA: Insufficient documentation

## 2023-04-22 DIAGNOSIS — R413 Other amnesia: Secondary | ICD-10-CM | POA: Diagnosis not present

## 2023-05-17 ENCOUNTER — Other Ambulatory Visit: Payer: Self-pay | Admitting: Internal Medicine

## 2023-05-17 DIAGNOSIS — Z1231 Encounter for screening mammogram for malignant neoplasm of breast: Secondary | ICD-10-CM

## 2023-07-01 ENCOUNTER — Ambulatory Visit
Admission: RE | Admit: 2023-07-01 | Discharge: 2023-07-01 | Disposition: A | Discharge status: Home / Self Care | Payer: BC Managed Care – PPO | Source: Ambulatory Visit | Attending: Internal Medicine | Admitting: Internal Medicine

## 2023-07-01 DIAGNOSIS — Z1231 Encounter for screening mammogram for malignant neoplasm of breast: Secondary | ICD-10-CM | POA: Diagnosis present

## 2023-10-28 ENCOUNTER — Ambulatory Visit (HOSPITAL_BASED_OUTPATIENT_CLINIC_OR_DEPARTMENT_OTHER): Payer: BC Managed Care – PPO | Admitting: Obstetrics & Gynecology

## 2024-02-06 ENCOUNTER — Ambulatory Visit (HOSPITAL_BASED_OUTPATIENT_CLINIC_OR_DEPARTMENT_OTHER): Payer: BC Managed Care – PPO | Admitting: Obstetrics & Gynecology

## 2024-02-06 ENCOUNTER — Encounter (HOSPITAL_BASED_OUTPATIENT_CLINIC_OR_DEPARTMENT_OTHER): Payer: Self-pay | Admitting: Obstetrics & Gynecology

## 2024-02-06 ENCOUNTER — Other Ambulatory Visit (HOSPITAL_COMMUNITY)
Admission: RE | Admit: 2024-02-06 | Discharge: 2024-02-06 | Disposition: A | Discharge status: Home / Self Care | Source: Ambulatory Visit | Attending: Obstetrics & Gynecology | Admitting: Obstetrics & Gynecology

## 2024-02-06 VITALS — BP 117/65 | HR 58 | Ht 66.0 in | Wt 186.6 lb

## 2024-02-06 DIAGNOSIS — Z124 Encounter for screening for malignant neoplasm of cervix: Secondary | ICD-10-CM | POA: Diagnosis present

## 2024-02-06 DIAGNOSIS — N3946 Mixed incontinence: Secondary | ICD-10-CM | POA: Diagnosis not present

## 2024-02-06 DIAGNOSIS — D251 Intramural leiomyoma of uterus: Secondary | ICD-10-CM

## 2024-02-06 DIAGNOSIS — Z01419 Encounter for gynecological examination (general) (routine) without abnormal findings: Secondary | ICD-10-CM | POA: Diagnosis not present

## 2024-02-06 DIAGNOSIS — Z780 Asymptomatic menopausal state: Secondary | ICD-10-CM

## 2024-02-06 NOTE — Progress Notes (Signed)
 ANNUAL EXAM Patient name: MELL MELLOTT MRN [id number]  Date of birth: Jun 16, 1961 Chief Complaint:   AEX  History of Present Illness:   Sharon Hicks is a 63 y.o. G0P0000 Caucasian female being seen today for a routine annual exam.  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  This has been renewed for two to three more months.  Denies vaginal bleeding.    Patient's last menstrual period was 10/12/2014.  Last pap 10/15/2022. Results were: NILM w/ HRHPV negative.  Pt desires yearly pap smears.  Aware this may not be covered. Last mammogram: 07/01/2023. Results were: normal. Family h/o breast cancer: no Last colonoscopy: 08/03/2021. Results were: abnormal adenomatous polyp . Family h/o colorectal cancer: no     02/06/2024    1:25 PM 10/15/2022    2:52 PM 10/04/2021    1:35 PM  Depression screen PHQ 2/9  Decreased Interest 0 0 0  Down, Depressed, Hopeless 0 0 0  PHQ - 2 Score 0 0 0         No data to display           Review of Systems:   Pertinent items are noted in HPI Denies any headaches, blurred vision, fatigue, shortness of breath, chest pain, abdominal pain, abnormal vaginal discharge/itching/odor/irritation, problems with periods, bowel movements, urination, or intercourse unless otherwise stated above. Pertinent History Reviewed:  Reviewed past medical,surgical, social and family history.  Reviewed problem list, medications and allergies. Physical Assessment:   Vitals:   02/06/24 1322  BP: 117/65  Pulse: (!) 58  Weight: 186 lb 9.6 oz (84.6 kg)  Height: 5\' 6"  (1.676 m)  Body mass index is 30.12 kg/m.        Physical Examination:   General appearance - well appearing, and in no distress  Mental status - alert, oriented to person, place, and time  Psych:  She has a normal mood and affect  Skin - warm and dry, normal color, no suspicious lesions noted  Chest - effort normal, all lung fields clear to  auscultation bilaterally  Heart - normal rate and regular rhythm  Neck:  midline trachea, no thyromegaly or nodules  Breasts - breasts appear normal, no suspicious masses, no skin or nipple changes or  axillary nodes  Abdomen - soft, nontender, nondistended, no masses or organomegaly  Pelvic - VULVA: normal appearing vulva with no masses, tenderness or lesions  VAGINA: normal appearing vagina with normal color and discharge, no lesions  CERVIX: normal appearing cervix without discharge or lesions, no CMT  Thin prep pap is done with reflex HR HPV  UTERUS: uterus is about 10 weeks and mobile, globular (stable on size)  ADNEXA: No adnexal masses or tenderness noted.  Rectal - normal rectal, good sphincter tone, no masses felt.   Extremities:  No swelling or varicosities noted  Chaperone present for exam, Hendricks Milo, CMA  Assessment & Plan:  1. Well woman exam with routine gynecological exam (Primary) - Pap smear obtained today - Mammogram 819/2024 - Colonoscopy 2022 - Bone mineral density done with Dr. Judithann Sheen - lab work done with PCP, Dr. Judithann Sheen - vaccines reviewed/updated  2. Postmenopausal - not on HRT  3. Intramural leiomyoma of uterus - stable uterus on exam  4. Mixed incontinence urge and stress  5. Cervical cancer screening - Cytology - PAP( [place])   Follow-up: Return in about 1 year (around 02/05/2025).  Jerene Bears, MD 02/06/2024 2:04  PM

## 2024-02-06 NOTE — Patient Instructions (Signed)
 When was your last tetanus injection?  Have you done a ferritin level?

## 2024-02-11 ENCOUNTER — Encounter (HOSPITAL_BASED_OUTPATIENT_CLINIC_OR_DEPARTMENT_OTHER): Payer: Self-pay | Admitting: Obstetrics & Gynecology

## 2024-02-11 LAB — CYTOLOGY - PAP
Comment: NEGATIVE
Diagnosis: UNDETERMINED — AB
High risk HPV: NEGATIVE

## 2024-04-10 ENCOUNTER — Other Ambulatory Visit: Payer: Self-pay | Admitting: Internal Medicine

## 2024-04-10 DIAGNOSIS — Z1231 Encounter for screening mammogram for malignant neoplasm of breast: Secondary | ICD-10-CM

## 2024-05-18 ENCOUNTER — Ambulatory Visit: Attending: Neurology

## 2024-05-18 DIAGNOSIS — R41841 Cognitive communication deficit: Secondary | ICD-10-CM | POA: Insufficient documentation

## 2024-05-18 NOTE — Therapy (Signed)
 OUTPATIENT SPEECH LANGUAGE PATHOLOGY  EVALUATION   Patient Name: Sharon Hicks MRN: [id number] DOB:[birth year], 63 y.o., female Today's Date: 05/18/2024  PCP: Reyes Costa, MD REFERRING PROVIDER: Jannett Fairly, MD   End of Session - 05/18/24 1626     Visit Number 1    Number of Visits 12    Date for SLP Re-Evaluation 08/10/24    SLP Start Time 1525    SLP Stop Time  1625    SLP Time Calculation (min) 60 min           Patient Active Problem List   Diagnosis Date Noted   Screen for colon cancer    Polyp of transverse colon    Anemia, unspecified 09/19/2018   Mixed incontinence urge and stress 08/01/2018   Non-traumatic rotator cuff tear 07/01/2015   Enlarged uterus 04/07/2015   Intramural leiomyoma of uterus 04/07/2015   Dystrophy, 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 Pt accompanied by: self  PERTINENT HISTORY & DIAGNOSTIC FINDINGS: Per Neurology note, 05/04/24, Concern for cognitive decline, possibly stress-related with family history of Alzheimer's. MRI in 2024 unremarkable for age. Functioning well in daily activities. SLUMS 28/30. Recent story repetition noted, possibly stress-related. Retiring this week. Discussed brain health pillars. Alzheimer's blood test deferred at this time due to anxiety risk and lack of significant impairment.  PAIN:  Are you having pain? No   FALLS: Has patient fallen in last 6 months?  No  LIVING ENVIRONMENT: Lives with: lives alone Lives in: House/apartment  PLOF:  Level of assistance: Independent with  ADLs Employment: Retired; recently   PATIENT GOALS   to improve memory  OBJECTIVE:   COGNITIVE COMMUNICATION: Overall cognitive status: Within functional limits for tasks assessed; endorsed functional attention and memory deficits  AUDITORY COMPREHENSION: Overall auditory comprehension: Appears intact    READING COMPREHENSION: Intact  EXPRESSION: verbal  VERBAL EXPRESSION: Level of generative/spontaneous verbalization: sentence and conversation  WRITTEN EXPRESSION: Dominant hand: right   Written expression: Appears intact  MOTOR SPEECH: Overall motor speech: Appears intact  ORAL MOTOR EXAMINATION: No functional deficits  STANDARDIZED ASSESSMENTS: Addenbrooke's Cognitive Examination - ACE III The Addenbrooke's Cognitive Examination-III (ACE-III) is a brief cognitive test that assesses five cognitive domains. The total score is 100 with higher scores indicating better cognitive functioning. Cut off scores of 88 and 82 are recommended for suspicion of dementia (88 has sensitivity of 1.00 and specificity of 0.96, 82 has sensitivity of 0.93 and specificity of 1.00). American Version A  Attention 18/18  Memory 22/26  Fluency 14/14  Language 26/26  Visuospatial 16/16  TOTAL ACE- III Score 96/100     PATIENT REPORTED OUTCOME MEASURES (PROM):  MULTIFACTORIAL MEMORY QUESTIONNAIRE (MMQ)  Administered patient self-reported outcome measure Multifactorial Memory Questionnaire (MMQ). The Multifactorial Memory Questionnaire [facility]) consists of three scales measuring separate aspects of metamemory; Satisfaction, Ability and Strategy.   Pt's responses are converted to T-Scores with severity levels based on pt's T-Score.   Severity Levels (T-score) Very Low - < 20 Low - 20 to 29 Below Average - 30-39 Average - 40 to 60 Above Average - 60 to 70 High - 71 to 80 Very High - >  80  Pt reports:  Below Average - 30-39 Memory Satisfaction (T-score: 31) Average - 40 to 60 Memory  Ability (T-score: 40) Average - 40 to 60 use of Memory Strategies (T-score: 47)      TODAY'S TREATMENT:  Pt educated re: role of SLP, results of assessment, results of PROM, purpose of ST with emphasis on education and compensations, factors that may be affecting cognition. Pt tearful at times given familial hx of dementia. Supportive counseling provided as appropriate.   PATIENT EDUCATION: Education details: as above Person educated: Patient Education method: Explanation Education comprehension: verbalized understanding  HOME EXERCISE PROGRAM:        To be given in upcoming sessions as appropriate    GOALS:  Goals reviewed with patient? Yes  SHORT TERM GOALS: Target date: 10 sessions  Pt will endorse successful implementation of at least x2 compensations for attention and memory.  Baseline: Goal status: INITIAL   2.  Pt will verbalize understanding of age-related cognitive changes. Baseline:  Goal status: INITIAL  3.  Pt will verbalize x3 ways to help prevent further cognitive decline. Baseline:  Goal status: INITIAL   LONG TERM GOALS: Target date: 12 weeks  Patient will demonstrate knowledge of appropriate activities to support cognitive functioning outside of ST. Baseline:  Goal status: INITIAL  2.  Pt will report improved memory per PROM. Baseline:  Goal status: INITIAL    ASSESSMENT:  CLINICAL IMPRESSION:  Patient is a 63 y.o. female who was seen today for cognitive-communication evaluation given subjective report of cognitive decline which is concerning to pt given familial hx of dementia. Assessment completed via formal means (ACE-III), motivational interviewing, and PROM (MMQ). Pt scored WFL on ACE-III with deficits only on memory subtests. Pt reports changes to attention and memory which are impacting her functionally at home and in the community per PROM. Given subjective reports of attention and memory changes and pt would benefit from ST services for  compensations and education for attention, memory, normal aging, and ways to prevent further cognitive decline.  OBJECTIVE IMPAIRMENTS include attention and memory. These impairments are limiting patient from managing medications, managing appointments, managing finances, household responsibilities, and effectively communicating at home and in community.  Patient will benefit from skilled SLP services to address above impairments and improve overall function.  REHAB POTENTIAL: Good  PLAN: SLP FREQUENCY: 1x/week  SLP DURATION: 12 weeks  PLANNED INTERVENTIONS: Internal/external aids, Functional tasks, SLP instruction and feedback, Compensatory strategies, and Patient/family education    Delon Bangs, M.S., CCC-SLP Speech-Language Pathologist [place] [facility] [phone] [place])  [facility] Outpatient Rehabilitation at [facility] [address] [place], [place], [address] Phone: [phone]   Fax:  [phone]

## 2024-05-20 ENCOUNTER — Ambulatory Visit

## 2024-05-26 ENCOUNTER — Ambulatory Visit

## 2024-05-26 DIAGNOSIS — R41841 Cognitive communication deficit: Secondary | ICD-10-CM

## 2024-05-26 NOTE — Therapy (Signed)
 OUTPATIENT SPEECH LANGUAGE PATHOLOGY  COGNITIVE-COMMUNICATION TREATMENT   Patient Name: Sharon Hicks MRN: [id number] DOB:[birth year], 63 y.o., female Today's Date: 05/26/2024  PCP: Reyes Costa, MD REFERRING PROVIDER: Jannett Fairly, MD   End of Session - 05/26/24 1249     SLP Start Time 1145    SLP Stop Time  1245    SLP Time Calculation (min) 60 min    Activity Tolerance Patient tolerated treatment well           Patient Active Problem List   Diagnosis Date Noted   Screen for colon cancer    Polyp of transverse colon    Anemia, unspecified 09/19/2018   Mixed incontinence urge and stress 08/01/2018   Non-traumatic rotator cuff tear 07/01/2015   Enlarged uterus 04/07/2015   Intramural leiomyoma of uterus 04/07/2015   Dystrophy, 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 Pt accompanied by: self  PERTINENT HISTORY & DIAGNOSTIC FINDINGS: Per Neurology note, 05/04/24, Concern for cognitive decline, possibly stress-related with family history of Alzheimer's. MRI in 2024 unremarkable for age. Functioning well in daily activities. SLUMS 28/30. Recent story repetition noted, possibly stress-related. Retiring this week. Discussed brain health pillars. Alzheimer's blood test deferred at this time due to anxiety risk and lack of significant impairment.  PAIN:  Are you having pain? No   FALLS: Has patient fallen in last 6 months?  No  LIVING ENVIRONMENT: Lives with: lives alone Lives in: House/apartment  PLOF:  Level of assistance: Independent with ADLs Employment:  Retired; recently   PATIENT GOALS   to improve memory  OBJECTIVE:    TODAY'S TREATMENT:  Pt educated on attention as the foundation for all cognition, types of attention, basic strategies to improve attention, and basic sleep hygiene. Discussion about currently used strategies and strategies to potentially improve attention and sleep. Will continue to address in upcoming sessions.   PATIENT EDUCATION: Education details: as above Person educated: Patient Education method: Explanation, Handout Education comprehension: verbalized understanding  HOME EXERCISE PROGRAM:        Begin implementing 1-2 attention strategies    GOALS:  Goals reviewed with patient? Yes  SHORT TERM GOALS: Target date: 10 sessions  Pt will endorse successful implementation of at least x2 compensations for attention and memory.  Baseline: Goal status: INITIAL   2.  Pt will verbalize understanding of age-related cognitive changes. Baseline:  Goal status: INITIAL  3.  Pt will verbalize x3 ways to help prevent further cognitive decline. Baseline:  Goal status: INITIAL   LONG TERM GOALS: Target date: 12 weeks  Patient will demonstrate knowledge of appropriate activities to support cognitive functioning outside of ST. Baseline:  Goal status: INITIAL  2.  Pt will report improved memory per PROM. Baseline:  Goal status: INITIAL    ASSESSMENT:  CLINICAL IMPRESSION:  Patient is a 63 y.o. female who was seen today for cognitive-communication treatment  given subjective report of cognitive decline which is concerning to pt given familial hx of dementia. Initial assessment completed via formal means (ACE-III), motivational interviewing, and PROM (MMQ). Pt scored WFL on ACE-III with deficits only on memory subtests. Pt reports changes to attention and memory  which are impacting her functionally at home and in the community per PROM.  See details of tx session above. Given subjective reports of attention  and memory changes and pt would benefit from ST services for compensations and education for attention, memory, normal aging, and ways to prevent further cognitive decline.  OBJECTIVE IMPAIRMENTS include attention and memory. These impairments are limiting patient from managing medications, managing appointments, managing finances, household responsibilities, and effectively communicating at home and in community.  Patient will benefit from skilled SLP services to address above impairments and improve overall function.  REHAB POTENTIAL: Good  PLAN: SLP FREQUENCY: 1x/week  SLP DURATION: 12 weeks  PLANNED INTERVENTIONS: Internal/external aids, Functional tasks, SLP instruction and feedback, Compensatory strategies, and Patient/family education    Delon Bangs, M.S., CCC-SLP Speech-Language Pathologist [place] [facility] [phone] [place])  [place] [facility] Outpatient Rehabilitation at [facility] [address] [place], [place], [address] Phone: [phone]   Fax:  [phone]

## 2024-05-28 ENCOUNTER — Ambulatory Visit

## 2024-05-28 ENCOUNTER — Telehealth: Payer: Self-pay

## 2024-05-28 ENCOUNTER — Encounter

## 2024-05-28 NOTE — Telephone Encounter (Signed)
 Contacted pt via telephone regarding missed ST appointment. Pt apologized noting it was accidental and that she will be attending her next scheduled appointment on 7/28 @ 1100.  Delon Bangs, M.S., CCC-SLP Speech-Language Pathologist [place] [facility] [phone] (ASCOM)

## 2024-06-08 ENCOUNTER — Ambulatory Visit

## 2024-06-08 DIAGNOSIS — R41841 Cognitive communication deficit: Secondary | ICD-10-CM

## 2024-06-08 NOTE — Therapy (Signed)
 OUTPATIENT SPEECH LANGUAGE PATHOLOGY  COGNITIVE-COMMUNICATION TREATMENT   Patient Name: Sharon Hicks MRN: [id number] DOB:[birth year], 63 y.o., female Today's Date: 06/08/2024  PCP: Reyes Costa, MD REFERRING PROVIDER: Jannett Fairly, MD   End of Session - 06/08/24 1201     Visit Number 3    Number of Visits 12    Date for SLP Re-Evaluation 08/10/24    SLP Start Time 1100    SLP Stop Time  1155    SLP Time Calculation (min) 55 min    Activity Tolerance Patient tolerated treatment well           Patient Active Problem List   Diagnosis Date Noted   Screen for colon cancer    Polyp of transverse colon    Anemia, unspecified 09/19/2018   Mixed incontinence urge and stress 08/01/2018   Non-traumatic rotator cuff tear 07/01/2015   Enlarged uterus 04/07/2015   Intramural leiomyoma of uterus 04/07/2015   Dystrophy, 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 Pt accompanied by: self  PERTINENT HISTORY & DIAGNOSTIC FINDINGS: Per Neurology note, 05/04/24, Concern for cognitive decline, possibly stress-related with family history of Alzheimer's. MRI in 2024 unremarkable for age. Functioning well in daily activities. SLUMS 28/30. Recent story repetition noted, possibly stress-related. Retiring this week. Discussed brain health pillars. Alzheimer's blood test deferred at this time due to anxiety risk and lack of significant impairment.  PAIN:  Are you having pain? No   FALLS: Has patient fallen in last 6 months?  No  LIVING ENVIRONMENT: Lives with: lives alone Lives in:  House/apartment  PLOF:  Level of assistance: Independent with ADLs Employment: Retired; recently   PATIENT GOALS   to improve memory  OBJECTIVE:    TODAY'S TREATMENT:  Review of material presented last session. Education provided re: types of memory, internal vs external memory strategies, and various internal/external memory strategies for to trial. Pt identified repetition and visualization as well as use of planner, decluttering/organization. Pt to purchase planner for upcoming sessions.    PATIENT EDUCATION: Education details: as above Person educated: Patient Education method: Programmer, multimedia, Handout Education comprehension: verbalized understanding  HOME EXERCISE PROGRAM:        Begin implementing 1-2 memory strategies    GOALS:  Goals reviewed with patient? Yes  SHORT TERM GOALS: Target date: 10 sessions  Pt will endorse successful implementation of at least x2 compensations for attention and memory.  Baseline: Goal status: INITIAL   2.  Pt will verbalize understanding of age-related cognitive changes. Baseline:  Goal status: INITIAL  3.  Pt will verbalize x3 ways to help prevent further cognitive decline. Baseline:  Goal status: INITIAL   LONG TERM GOALS: Target date: 12 weeks  Patient will demonstrate knowledge of appropriate activities to support cognitive functioning outside of ST. Baseline:  Goal status: INITIAL  2.  Pt will report improved memory per PROM. Baseline:  Goal status: INITIAL    ASSESSMENT:  CLINICAL IMPRESSION:  Patient is a 63 y.o. female who was seen today for cognitive-communication treatment  given subjective report of cognitive decline which is concerning to pt given familial hx of dementia. Initial assessment completed via formal means (  ACE-III), motivational interviewing, and PROM (MMQ). Pt scored WFL on ACE-III with deficits only on memory subtests. Pt reports changes to attention and memory which are impacting her  functionally at home and in the community per PROM.  See details of tx session above. Given subjective reports of attention and memory changes and pt would benefit from ST services for compensations and education for attention, memory, normal aging, and ways to prevent further cognitive decline.  OBJECTIVE IMPAIRMENTS include attention and memory. These impairments are limiting patient from managing medications, managing appointments, managing finances, household responsibilities, and effectively communicating at home and in community.  Patient will benefit from skilled SLP services to address above impairments and improve overall function.  REHAB POTENTIAL: Good  PLAN: SLP FREQUENCY: 1x/week  SLP DURATION: 12 weeks  PLANNED INTERVENTIONS: Internal/external aids, Functional tasks, SLP instruction and feedback, Compensatory strategies, and Patient/family education    Delon Bangs, M.S., CCC-SLP Speech-Language Pathologist [place] [facility] [phone] [place])  [place] [facility] Outpatient Rehabilitation at [facility] [address] [place], [place], [address] Phone: [phone]   Fax:  [phone]

## 2024-06-15 ENCOUNTER — Ambulatory Visit: Attending: Neurology

## 2024-06-15 DIAGNOSIS — R41841 Cognitive communication deficit: Secondary | ICD-10-CM | POA: Insufficient documentation

## 2024-06-15 NOTE — Therapy (Signed)
 OUTPATIENT SPEECH LANGUAGE PATHOLOGY  COGNITIVE-COMMUNICATION TREATMENT   Patient Name: Sharon Hicks MRN: [id number] DOB:[birth year], 63 y.o., female Today's Date: 06/15/2024  PCP: Reyes Costa, MD REFERRING PROVIDER: Jannett Fairly, MD   End of Session - 06/15/24 1526     Visit Number 4    Number of Visits 12    Date for SLP Re-Evaluation 08/10/24    SLP Start Time 1530    SLP Stop Time  1615    SLP Time Calculation (min) 45 min    Activity Tolerance Patient tolerated treatment well           Patient Active Problem List   Diagnosis Date Noted   Screen for colon cancer    Polyp of transverse colon    Anemia, unspecified 09/19/2018   Mixed incontinence urge and stress 08/01/2018   Non-traumatic rotator cuff tear 07/01/2015   Enlarged uterus 04/07/2015   Intramural leiomyoma of uterus 04/07/2015   Dystrophy, 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 Pt accompanied by: self  PERTINENT HISTORY & DIAGNOSTIC FINDINGS: Per Neurology note, 05/04/24, Concern for cognitive decline, possibly stress-related with family history of Alzheimer's. MRI in 2024 unremarkable for age. Functioning well in daily activities. SLUMS 28/30. Recent story repetition noted, possibly stress-related. Retiring this week. Discussed brain health pillars. Alzheimer's blood test deferred at this time due to anxiety risk and lack of significant impairment.  PAIN:  Are you having pain? No   FALLS: Has patient fallen in last 6 months?  No  LIVING ENVIRONMENT: Lives with: lives alone Lives in:  House/apartment  PLOF:  Level of assistance: Independent with ADLs Employment: Retired; recently   PATIENT GOALS   to improve memory  OBJECTIVE:    TODAY'S TREATMENT:  Review of material presented last session.Pt reports successful implementation of several internal/external memory, attention, and sleep hygiene strategies (e.g. talking out loud, use of blue light glasses). Endorsed difficulty setting routine sleep time on vacation and with friends calling her later in the evening. Discussed setting boundaries/do not disturb. Pt to consider do not disturb for sleep. Pt will try setting a more consistent bedtime this week. Pt educated on how to use iPhone calendar and set reminders. Pt able to return demo will plan to enter appointments for HEP.  Reviewed using written lists - pt planning to create med list and emergency contact list. Discussed potentially using journaling to improve short term and prospective memory. Will target in upcoming sessions. Pt also identified mental and physical clutter she needs to purge. Pt to continue to work on in upcoming sessions.   PATIENT EDUCATION: Education details: as above Person educated: Patient Education method: Programmer, multimedia, Handout Education comprehension: verbalized understanding  HOME EXERCISE PROGRAM:        Continue implementation of strategies  Use calendar / alerts on iPhone    GOALS:  Goals reviewed with patient? Yes  SHORT TERM GOALS: Target date: 10 sessions  Pt will endorse successful implementation of at least x2 compensations for attention and memory.  Baseline: Goal status: INITIAL   2.  Pt will verbalize understanding of age-related cognitive changes. Baseline:  Goal status: INITIAL  3.  Pt  will verbalize x3 ways to help prevent further cognitive decline. Baseline:  Goal status: INITIAL   LONG TERM GOALS: Target date: 12 weeks  Patient will demonstrate knowledge of appropriate activities to support cognitive  functioning outside of ST. Baseline:  Goal status: INITIAL  2.  Pt will report improved memory per PROM. Baseline:  Goal status: INITIAL    ASSESSMENT:  CLINICAL IMPRESSION:  Patient is a 63 y.o. female who was seen today for cognitive-communication treatment  given subjective report of cognitive decline which is concerning to pt given familial hx of dementia. Initial assessment completed via formal means (ACE-III), motivational interviewing, and PROM (MMQ). Pt scored WFL on ACE-III with deficits only on memory subtests. Pt reports changes to attention and memory which are impacting her functionally at home and in the community per PROM.  See details of tx session above. Given subjective reports of attention and memory changes and pt would benefit from ST services for compensations and education for attention, memory, normal aging, and ways to prevent further cognitive decline.  OBJECTIVE IMPAIRMENTS include attention and memory. These impairments are limiting patient from managing medications, managing appointments, managing finances, household responsibilities, and effectively communicating at home and in community.  Patient will benefit from skilled SLP services to address above impairments and improve overall function.  REHAB POTENTIAL: Good  PLAN: SLP FREQUENCY: 1x/week  SLP DURATION: 12 weeks  PLANNED INTERVENTIONS: Internal/external aids, Functional tasks, SLP instruction and feedback, Compensatory strategies, and Patient/family education    Delon Bangs, M.S., CCC-SLP Speech-Language Pathologist [facility] [phone] [place])  [facility] Outpatient Rehabilitation at [facility] [address] [place], [place], [address] Phone: [phone]   Fax:  [phone]

## 2024-06-18 ENCOUNTER — Ambulatory Visit

## 2024-06-18 DIAGNOSIS — R41841 Cognitive communication deficit: Secondary | ICD-10-CM | POA: Diagnosis not present

## 2024-06-18 NOTE — Therapy (Signed)
 OUTPATIENT SPEECH LANGUAGE PATHOLOGY  COGNITIVE-COMMUNICATION TREATMENT   Patient Name: Sharon Hicks MRN: [id number] DOB:03-Apr-1961, 63 y.o., female Today's Date: 06/18/2024  PCP: Reyes Costa, MD REFERRING PROVIDER: Jannett Fairly, MD   End of Session - 06/18/24 1358     Visit Number 5    Number of Visits 12    Date for SLP Re-Evaluation 08/10/24    SLP Start Time 1400    SLP Stop Time  1445    SLP Time Calculation (min) 45 min    Activity Tolerance Patient tolerated treatment well           Patient Active Problem List   Diagnosis Date Noted   Screen for colon cancer    Polyp of transverse colon    Anemia, unspecified 09/19/2018   Mixed incontinence urge and stress 08/01/2018   Non-traumatic rotator cuff tear 07/01/2015   Enlarged uterus 04/07/2015   Intramural leiomyoma of uterus 04/07/2015   Dystrophy, 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 Pt accompanied by: self  PERTINENT HISTORY & DIAGNOSTIC FINDINGS: Per Neurology note, 05/04/24, Concern for cognitive decline, possibly stress-related with family history of Alzheimer's. MRI in 2024 unremarkable for age. Functioning well in daily activities. SLUMS 28/30. Recent story repetition noted, possibly stress-related. Retiring this week. Discussed brain health pillars. Alzheimer's blood test deferred at this time due to anxiety risk and lack of significant impairment.  PAIN:  Are you having pain? No   FALLS: Has patient fallen in last 6 months?  No  LIVING ENVIRONMENT: Lives with: lives alone Lives in:  House/apartment  PLOF:  Level of assistance: Independent with ADLs Employment: Retired; recently   PATIENT GOALS   to improve memory  OBJECTIVE:    TODAY'S TREATMENT:  Review of material presented last session.Pt reports successful implementation of several internal/external memory, attention, and sleep hygiene strategies (e.g. talking out loud, use of blue light glasses). Endorsed improvement setting routine sleep time. Reviewed on how to use iPhone calendar and set reminders Reviewed using written lists - pt planning to create med list and emergency contact list. Introduced journaling for improved short term and prospective memory. Handout provided. Will target in upcoming sessions. Pt also identified mental and physical clutter she needs to purge and is planning to declutter 3x/week.   PATIENT EDUCATION: Education details: as above Person educated: Patient Education method: Programmer, multimedia, Handout Education comprehension: verbalized understanding  HOME EXERCISE PROGRAM:        Continue implementation of strategies  Use calendar / alerts on iPhone    GOALS:  Goals reviewed with patient? Yes  SHORT TERM GOALS: Target date: 10 sessions  Pt will endorse successful implementation of at least x2 compensations for attention and memory.  Baseline: Goal status: INITIAL   2.  Pt will verbalize understanding of age-related cognitive changes. Baseline:  Goal status: INITIAL  3.  Pt will verbalize x3 ways to help prevent further cognitive decline. Baseline:  Goal status: INITIAL   LONG TERM GOALS: Target date: 12 weeks  Patient will demonstrate knowledge of appropriate activities to support cognitive functioning outside of ST. Baseline:  Goal status: INITIAL  2.  Pt will report improved  memory per PROM. Baseline:  Goal status: INITIAL    ASSESSMENT:  CLINICAL IMPRESSION:  Patient is a 63 y.o. female who was seen today for cognitive-communication treatment  given  subjective report of cognitive decline which is concerning to pt given familial hx of dementia. Initial assessment completed via formal means (ACE-III), motivational interviewing, and PROM (MMQ). Pt scored WFL on ACE-III with deficits only on memory subtests. Pt reports changes to attention and memory which are impacting her functionally at home and in the community per PROM.  See details of tx session above. Given subjective reports of attention and memory changes and pt would benefit from ST services for compensations and education for attention, memory, normal aging, and ways to prevent further cognitive decline.  OBJECTIVE IMPAIRMENTS include attention and memory. These impairments are limiting patient from managing medications, managing appointments, managing finances, household responsibilities, and effectively communicating at home and in community.  Patient will benefit from skilled SLP services to address above impairments and improve overall function.  REHAB POTENTIAL: Good  PLAN: SLP FREQUENCY: 1x/week  SLP DURATION: 12 weeks  PLANNED INTERVENTIONS: Internal/external aids, Functional tasks, SLP instruction and feedback, Compensatory strategies, and Patient/family education    Delon Bangs, M.S., CCC-SLP Speech-Language Pathologist [place] [facility] [phone] [place])  [facility] Outpatient Rehabilitation at [facility] [address] [place], [place], [address] Phone: [phone]   Fax:  [phone]

## 2024-06-22 ENCOUNTER — Ambulatory Visit

## 2024-06-22 DIAGNOSIS — R41841 Cognitive communication deficit: Secondary | ICD-10-CM

## 2024-06-22 NOTE — Therapy (Signed)
 OUTPATIENT SPEECH LANGUAGE PATHOLOGY  COGNITIVE-COMMUNICATION TREATMENT   Patient Name: Sharon Hicks MRN: [id number] DOB:1961/10/04, 63 y.o., female Today's Date: 06/22/2024  PCP: Reyes Costa, MD REFERRING PROVIDER: Jannett Fairly, MD   End of Session - 06/22/24 0935     Visit Number 6    Number of Visits 12    Date for SLP Re-Evaluation 08/10/24    SLP Start Time 0935    SLP Stop Time  1015    SLP Time Calculation (min) 40 min    Activity Tolerance Patient tolerated treatment well           Patient Active Problem List   Diagnosis Date Noted   Screen for colon cancer    Polyp of transverse colon    Anemia, unspecified 09/19/2018   Mixed incontinence urge and stress 08/01/2018   Non-traumatic rotator cuff tear 07/01/2015   Enlarged uterus 04/07/2015   Intramural leiomyoma of uterus 04/07/2015   Dystrophy, 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 Pt accompanied by: self  PERTINENT HISTORY & DIAGNOSTIC FINDINGS: Per Neurology note, 05/04/24, Concern for cognitive decline, possibly stress-related with family history of Alzheimer's. MRI in 2024 unremarkable for age. Functioning well in daily activities. SLUMS 28/30. Recent story repetition noted, possibly stress-related. Retiring this week. Discussed brain health pillars. Alzheimer's blood test deferred at this time due to anxiety risk and lack of significant impairment.  PAIN:  Are you having pain? No   FALLS: Has patient fallen in last 6 months?  No  LIVING ENVIRONMENT: Lives with: lives alone Lives in:  House/apartment  PLOF:  Level of assistance: Independent with ADLs Employment: Retired; recently   PATIENT GOALS   to improve memory  OBJECTIVE:    TODAY'S TREATMENT:  Review of material presented last session. Pt reports successful implementation of several internal/external memory, attention, and sleep hygiene strategies since initiating ST services. Since last week, has implemented at least x3 new strategies. Pt using larger paper calendar and iPhone calendar to manage time. Pt also started journaling and noted decluttering x2 drawers yesterday. Education initiated re: normal changes to cognition and ways to promote cognition. Handout provided.  PATIENT EDUCATION: Education details: as above Person educated: Patient Education method: Programmer, multimedia, Handout Education comprehension: verbalized understanding  HOME EXERCISE PROGRAM:        Continue implementation of strategies  Use calendar / alerts on iPhone    GOALS:  Goals reviewed with patient? Yes  SHORT TERM GOALS: Target date: 10 sessions  Pt will endorse successful implementation of at least x2 compensations for attention and memory.  Baseline: Goal status: INITIAL   2.  Pt will verbalize understanding of age-related cognitive changes. Baseline:  Goal status: INITIAL  3.  Pt will verbalize x3 ways to help prevent further cognitive decline. Baseline:  Goal status: INITIAL   LONG TERM GOALS: Target date: 12 weeks  Patient will demonstrate knowledge of appropriate activities to support cognitive functioning outside of ST. Baseline:  Goal status: INITIAL  2.  Pt will report improved memory per PROM. Baseline:  Goal status: INITIAL    ASSESSMENT:  CLINICAL IMPRESSION:  Patient is a 63 y.o. female who was  seen today for cognitive-communication treatment  given subjective report of cognitive decline which is concerning to pt given familial hx of dementia. Initial assessment completed via formal means  (ACE-III), motivational interviewing, and PROM (MMQ). Pt scored WFL on ACE-III with deficits only on memory subtests. Pt reports changes to attention and memory which are impacting her functionally at home and in the community per PROM.  See details of tx session above. Given subjective reports of attention and memory changes and pt would benefit from ST services for compensations and education for attention, memory, normal aging, and ways to prevent further cognitive decline.  OBJECTIVE IMPAIRMENTS include attention and memory. These impairments are limiting patient from managing medications, managing appointments, managing finances, household responsibilities, and effectively communicating at home and in community.  Patient will benefit from skilled SLP services to address above impairments and improve overall function.  REHAB POTENTIAL: Good  PLAN: SLP FREQUENCY: 1x/week  SLP DURATION: 12 weeks  PLANNED INTERVENTIONS: Internal/external aids, Functional tasks, SLP instruction and feedback, Compensatory strategies, and Patient/family education    Delon Bangs, M.S., CCC-SLP Speech-Language Pathologist [place] [facility] [phone] [place])   [facility] Outpatient Rehabilitation at [facility] [address] [place], [place], [address] Phone: [phone]   Fax:  [phone]

## 2024-06-30 ENCOUNTER — Encounter

## 2024-07-01 ENCOUNTER — Ambulatory Visit
Admission: RE | Admit: 2024-07-01 | Discharge: 2024-07-01 | Disposition: A | Discharge status: Home / Self Care | Source: Ambulatory Visit | Attending: Internal Medicine | Admitting: Internal Medicine

## 2024-07-01 DIAGNOSIS — Z1231 Encounter for screening mammogram for malignant neoplasm of breast: Secondary | ICD-10-CM | POA: Diagnosis present

## 2024-07-02 ENCOUNTER — Ambulatory Visit

## 2024-07-02 DIAGNOSIS — R41841 Cognitive communication deficit: Secondary | ICD-10-CM

## 2024-07-02 NOTE — Therapy (Signed)
 OUTPATIENT SPEECH LANGUAGE PATHOLOGY  COGNITIVE-COMMUNICATION TREATMENT   Patient Name: Sharon Hicks MRN: [id number] DOB:[birth year], 63 y.o., female Today's Date: 07/02/2024  PCP: Reyes Costa, MD REFERRING PROVIDER: Jannett Fairly, MD   End of Session - 07/02/24 1100     Visit Number 7    Number of Visits 12    Date for SLP Re-Evaluation 08/10/24    SLP Start Time 1100    SLP Stop Time  1200    SLP Time Calculation (min) 60 min    Activity Tolerance Patient tolerated treatment well           Patient Active Problem List   Diagnosis Date Noted   Screen for colon cancer    Polyp of transverse colon    Anemia, unspecified 09/19/2018   Mixed incontinence urge and stress 08/01/2018   Non-traumatic rotator cuff tear 07/01/2015   Enlarged uterus 04/07/2015   Intramural leiomyoma of uterus 04/07/2015   Dystrophy, 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 Pt accompanied by: self  PERTINENT HISTORY & DIAGNOSTIC FINDINGS: Per Neurology note, 05/04/24, Concern for cognitive decline, possibly stress-related with family history of Alzheimer's. MRI in 2024 unremarkable for age. Functioning well in daily activities. SLUMS 28/30. Recent story repetition noted, possibly stress-related. Retiring this week. Discussed brain health pillars. Alzheimer's blood test deferred at this time due to anxiety risk and lack of significant impairment.  PAIN:  Are you having pain? No   FALLS: Has patient fallen in last 6 months?  No  LIVING ENVIRONMENT: Lives with: lives alone Lives in:  House/apartment  PLOF:  Level of assistance: Independent with ADLs Employment: Retired; recently   PATIENT GOALS   to improve memory  OBJECTIVE:    TODAY'S TREATMENT:  Review of material presented last session. Pt reports successful implementation of several internal/external memory, attention, and sleep hygiene strategies since initiating ST services. Pt noted misplacing medication while unpacking on vacation. Pt notably frustrated by the experience. Discussed strategies of talking aloud to improve recall (e.g. I'm putting medication in the drawer in the bedside table to take later) and trying to de-escalate emotional responses (e.g. stopping, taking deep breaths, say it's going to be okay.) when something similar occurs. Pt commented that she believes she has long COVID. Provided pt with handout of cognitive changes that may occur in long COVID. Encouraged pt to discuss with her physicians.  PATIENT EDUCATION: Education details: as above Person educated: Patient Education method: Programmer, multimedia, Handout Education comprehension: verbalized understanding  HOME EXERCISE PROGRAM:        Continue implementation of strategies  Use calendar / alerts on iPhone    GOALS:  Goals reviewed with patient? Yes  SHORT TERM GOALS: Target date: 10 sessions  Pt will endorse successful implementation of at least x2 compensations for attention and memory.  Baseline: Goal status: INITIAL   2.  Pt will verbalize understanding of age-related cognitive changes. Baseline:  Goal status: INITIAL  3.  Pt will verbalize x3 ways to help prevent further cognitive decline. Baseline:  Goal status: INITIAL   LONG TERM GOALS: Target date: 12 weeks  Patient will demonstrate knowledge of appropriate activities to support  cognitive functioning outside of ST. Baseline:  Goal status: INITIAL  2.  Pt will report improved memory per PROM. Baseline:  Goal status: INITIAL    ASSESSMENT:  CLINICAL  IMPRESSION:  Patient is a 63 y.o. female who was seen today for cognitive-communication treatment  given subjective report of cognitive decline which is concerning to pt given familial hx of dementia. Initial assessment completed via formal means (ACE-III), motivational interviewing, and PROM (MMQ). Pt scored WFL on ACE-III with deficits only on memory subtests. Pt reports changes to attention and memory which are impacting her functionally at home and in the community per PROM.  See details of tx session above. Given subjective reports of attention and memory changes and pt would benefit from ST services for compensations and education for attention, memory, normal aging, and ways to prevent further cognitive decline.  OBJECTIVE IMPAIRMENTS include attention and memory. These impairments are limiting patient from managing medications, managing appointments, managing finances, household responsibilities, and effectively communicating at home and in community.  Patient will benefit from skilled SLP services to address above impairments and improve overall function.  REHAB POTENTIAL: Good  PLAN: SLP FREQUENCY: 1x/week  SLP DURATION: 12 weeks  PLANNED INTERVENTIONS: Internal/external aids, Functional tasks, SLP instruction and feedback, Compensatory strategies, and Patient/family education    Delon Bangs, M.S., CCC-SLP Speech-Language Pathologist [facility] [phone] [place])  [place] [facility] Outpatient Rehabilitation at [facility] [address] [place], [place], [address] Phone: [phone]   Fax:  [phone]

## 2024-07-06 ENCOUNTER — Encounter

## 2024-07-09 ENCOUNTER — Ambulatory Visit

## 2024-07-09 DIAGNOSIS — R41841 Cognitive communication deficit: Secondary | ICD-10-CM | POA: Diagnosis not present

## 2024-07-09 NOTE — Therapy (Signed)
 OUTPATIENT SPEECH LANGUAGE PATHOLOGY  COGNITIVE-COMMUNICATION TREATMENT   Patient Name: Sharon Hicks MRN: [id number] DOB:1961/09/03, 63 y.o., female Today's Date: 07/09/2024  PCP: Reyes Costa, MD REFERRING PROVIDER: Jannett Fairly, MD   End of Session - 07/09/24 1206     Visit Number 8    Number of Visits 12    Date for SLP Re-Evaluation 08/10/24    SLP Start Time 1100    SLP Stop Time  1200    SLP Time Calculation (min) 60 min           Patient Active Problem List   Diagnosis Date Noted   Screen for colon cancer    Polyp of transverse colon    Anemia, unspecified 09/19/2018   Mixed incontinence urge and stress 08/01/2018   Non-traumatic rotator cuff tear 07/01/2015   Enlarged uterus 04/07/2015   Intramural leiomyoma of uterus 04/07/2015   Dystrophy, 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 Pt accompanied by: self  PERTINENT HISTORY & DIAGNOSTIC FINDINGS: Per Neurology note, 05/04/24, Concern for cognitive decline, possibly stress-related with family history of Alzheimer's. MRI in 2024 unremarkable for age. Functioning well in daily activities. SLUMS 28/30. Recent story repetition noted, possibly stress-related. Retiring this week. Discussed brain health pillars. Alzheimer's blood test deferred at this time due to anxiety risk and lack of significant impairment.  PAIN:  Are you having pain? No   FALLS: Has patient fallen in last 6 months?  No  LIVING ENVIRONMENT: Lives with: lives alone Lives in: House/apartment  PLOF:  Level of assistance:  Independent with ADLs Employment: Retired; recently   PATIENT GOALS   to improve memory  OBJECTIVE:    TODAY'S TREATMENT:  Review of material presented last session. Pt reports successful implementation of several internal/external memory, attention, and sleep hygiene strategies since initiating ST services. Pt encouraged to continue current strategies. Pt endorsed difficulty completing household tasks/locating items due to need to declutter, but inability to do so because of overcommitment in personal schedule. Educated on importance of setting boundaries/saying no to protect her time. Pt also educated on cognitive changes re: B12 deficiency as pt has been receiving B12 supplementation.   PATIENT EDUCATION: Education details: as above Person educated: Patient Education method: Programmer, multimedia, Handout Education comprehension: verbalized understanding  HOME EXERCISE PROGRAM:        Continue implementation of strategies  Use calendar / alerts on iPhone    GOALS:  Goals reviewed with patient? Yes  SHORT TERM GOALS: Target date: 10 sessions  Pt will endorse successful implementation of at least x2 compensations for attention and memory.  Baseline: Goal status: INITIAL   2.  Pt will verbalize understanding of age-related cognitive changes. Baseline:  Goal status: INITIAL  3.  Pt will verbalize x3 ways to help prevent further cognitive decline. Baseline:  Goal status: INITIAL   LONG TERM GOALS: Target date: 12 weeks  Patient will demonstrate knowledge of appropriate activities to support cognitive functioning outside of ST. Baseline:  Goal status: INITIAL  2.  Pt will report improved memory per PROM. Baseline:  Goal status: INITIAL    ASSESSMENT:  CLINICAL IMPRESSION:  Patient is a 63 y.o.  female who was seen today for cognitive-communication treatment  given subjective report of cognitive decline which is concerning to pt given familial hx of dementia. Initial  assessment completed via formal means (ACE-III), motivational interviewing, and PROM (MMQ). Pt scored WFL on ACE-III with deficits only on memory subtests. Pt reports changes to attention and memory which are impacting her functionally at home and in the community per PROM.  See details of tx session above. Given subjective reports of attention and memory changes and pt would benefit from ST services for compensations and education for attention, memory, normal aging, and ways to prevent further cognitive decline.  OBJECTIVE IMPAIRMENTS include attention and memory. These impairments are limiting patient from managing medications, managing appointments, managing finances, household responsibilities, and effectively communicating at home and in community.  Patient will benefit from skilled SLP services to address above impairments and improve overall function.  REHAB POTENTIAL: Good  PLAN: SLP FREQUENCY: 1x/week  SLP DURATION: 12 weeks  PLANNED INTERVENTIONS: Internal/external aids, Functional tasks, SLP instruction and feedback, Compensatory strategies, and Patient/family education    Delon Bangs, M.S., CCC-SLP Speech-Language Pathologist [facility] [phone] [place])  [facility] Outpatient Rehabilitation at [facility] [address] [place], [place], [address] Phone: [phone]   Fax:  [phone]

## 2024-07-21 ENCOUNTER — Telehealth: Payer: Self-pay

## 2024-07-21 ENCOUNTER — Ambulatory Visit: Attending: Neurology

## 2024-07-21 ENCOUNTER — Encounter

## 2024-07-21 DIAGNOSIS — R41841 Cognitive communication deficit: Secondary | ICD-10-CM | POA: Insufficient documentation

## 2024-07-21 NOTE — Telephone Encounter (Signed)
 Pt did not show for today's appointment. Contacted pt by cell phone, and she noted that she did not have this appointment written down. Confirmed upcoming appointments with pt.   Delon Bangs, M.S., CCC-SLP Speech-Language Pathologist  [facility] [phone] [place])

## 2024-07-30 ENCOUNTER — Encounter

## 2024-08-03 ENCOUNTER — Ambulatory Visit

## 2024-08-04 ENCOUNTER — Encounter

## 2024-08-10 ENCOUNTER — Encounter

## 2024-08-10 ENCOUNTER — Ambulatory Visit

## 2024-08-10 DIAGNOSIS — R41841 Cognitive communication deficit: Secondary | ICD-10-CM

## 2024-08-10 NOTE — Therapy (Signed)
 OUTPATIENT SPEECH LANGUAGE PATHOLOGY  COGNITIVE-COMMUNICATION TREATMENT / DISCHARGE SUMMARY   Patient Name: Sharon Hicks MRN: [id number] DOB:Oct 17, 1961, 63 y.o., female Today's Date: 08/10/2024  PCP: Reyes Costa, MD REFERRING PROVIDER: Jannett Fairly, MD   End of Session - 08/10/24 1014     Visit Number 9    Number of Visits 12    Date for Recertification  08/10/24    SLP Start Time 0930    SLP Stop Time  1015    SLP Time Calculation (min) 45 min    Activity Tolerance Patient tolerated treatment well           Patient Active Problem List   Diagnosis Date Noted   Screen for colon cancer    Polyp of transverse colon    Anemia, unspecified 09/19/2018   Mixed incontinence urge and stress 08/01/2018   Non-traumatic rotator cuff tear 07/01/2015   Enlarged uterus 04/07/2015   Intramural leiomyoma of uterus 04/07/2015   Dystrophy, 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 Pt accompanied by: self  PERTINENT HISTORY & DIAGNOSTIC FINDINGS: Per Neurology note, 05/04/24, Concern for cognitive decline, possibly stress-related with family history of Alzheimer's. MRI in 2024 unremarkable for age. Functioning well in daily activities. SLUMS 28/30. Recent story repetition noted, possibly stress-related. Retiring this week. Discussed brain health pillars. Alzheimer's blood test deferred at this time due to anxiety risk and lack of significant impairment.  PAIN:  Are you having pain? No   FALLS: Has patient fallen in last 6 months?  No  LIVING ENVIRONMENT: Lives with: lives  alone Lives in: House/apartment  PLOF:  Level of assistance: Independent with ADLs Employment: Retired; recently   PATIENT GOALS   to improve memory  OBJECTIVE:    TODAY'S TREATMENT:  Review of material presented last session. Pt reports successful implementation of several internal/external memory, attention, and sleep hygiene strategies since initiating ST services. Pt encouraged to continue current strategies. Reviewed progress to date and results of repeated PROM (noted below).   MULTIFACTORIAL MEMORY QUESTIONNAIRE (MMQ)  Administered patient self-reported outcome measure Multifactorial Memory Questionnaire (MMQ). The Multifactorial Memory Questionnaire [facility]) consists of three scales measuring separate aspects of metamemory; Satisfaction, Ability and Strategy.   Pt's responses are converted to T-Scores with severity levels based on pt's T-Score.   Severity Levels (T-score) Very Low - < 20 Low - 20 to 29 Below Average - 30-39 Average - 40 to 60 Above Average - 60 to 70 High - 71 to 80 Very High - > 80  Pt reports:  Low - 20 to 29 Memory Satisfaction (T-score: 39) Average - 40 to 60 Memory Ability (T-score: 50) Below Average - 30-39 use of Memory Strategies (T-score: 53)    PATIENT EDUCATION: Education details: as above Person educated: Patient Education method: Explanation, Handout Education comprehension: verbalized understanding  HOME EXERCISE PROGRAM:        Continue implementation of strategies  Use calendar / alerts on iPhone    GOALS:  Goals reviewed with patient? Yes  SHORT TERM GOALS: Target date: 10 sessions  Pt will endorse successful implementation of at least x2 compensations for attention and memory.  Baseline: Goal  status: MET   2.  Pt will verbalize understanding of age-related cognitive changes. Baseline:  Goal status: MET  3.  Pt will verbalize x3 ways to help prevent further cognitive decline. Baseline:  Goal status: MET   LONG  TERM GOALS: Target date: 12 weeks  Patient will demonstrate knowledge of appropriate activities to support cognitive functioning outside of ST. Baseline:  Goal status: MET  2.  Pt will report improved memory per PROM. Baseline:  Goal status:MET    ASSESSMENT:  CLINICAL IMPRESSION:  Patient is a 63 y.o. female who was seen today for cognitive-communication treatment  given subjective report of cognitive decline which is concerning to pt given familial hx of dementia. Pt has successfully implemented several compensations and demonstrates understanding of normal aging as well as ways to prevent further cognitive decline. Pt subjectively reports improved memory - which is also noted on repeat PROM. Pt has met all ST goals and is now formally d/c'd from ST. See details of tx session above.    PLAN: D/C ST    Delon Bangs, M.S., CCC-SLP Speech-Language Pathologist [facility] [phone] [place])  [facility] Outpatient Rehabilitation at [facility] [address] [place], [place], [address] Phone: [phone]   Fax:  [phone]

## 2024-12-09 ENCOUNTER — Ambulatory Visit: Payer: Self-pay

## 2024-12-09 ENCOUNTER — Ambulatory Visit (INDEPENDENT_AMBULATORY_CARE_PROVIDER_SITE_OTHER)

## 2024-12-09 ENCOUNTER — Ambulatory Visit

## 2024-12-09 ENCOUNTER — Ambulatory Visit
Admission: EM | Admit: 2024-12-09 | Discharge: 2024-12-09 | Disposition: A | Discharge status: Home / Self Care | Attending: Emergency Medicine | Admitting: Emergency Medicine

## 2024-12-09 DIAGNOSIS — S62114A Nondisplaced fracture of triquetrum [cuneiform] bone, right wrist, initial encounter for closed fracture: Secondary | ICD-10-CM

## 2024-12-09 DIAGNOSIS — M25531 Pain in right wrist: Secondary | ICD-10-CM | POA: Diagnosis not present

## 2024-12-09 NOTE — ED Triage Notes (Signed)
 Pt states she slipped on the ice last night and fell on her right wrist. Now having swelling and pain with movement on her right wrist and hand.

## 2024-12-09 NOTE — Discharge Instructions (Addendum)
 Wear the wrist splint and sling as directed.    As directed, take Tylenol or ibuprofen as needed, rest and elevate your wrist, apply ice packs.    Follow-up with an orthopedist such as the one listed below.

## 2024-12-09 NOTE — ED Provider Notes (Signed)
 " [facility]    CSN: [id number] Arrival date & time: 12/09/24  1147      History   Chief Complaint Chief Complaint  Patient presents with   Wrist Pain    HPI Sharon Hicks is a 64 y.o. female.  Patient presents with right wrist pain and swelling since she accidentally slipped and fell on the ice last night.  Her wrist has bruising.  She has been treating her wrist with Tylenol and ice packs.  No open wounds, numbness, weakness.  The history is provided by the patient and medical records.    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   Arthralgia of shoulder 04/14/2014   Depression 04/14/2014   Hyperlipidemia 04/14/2014   Adult hypothyroidism 04/14/2014    Past Surgical History:  Procedure Laterality Date   APPENDECTOMY  02/2007   cardiac evaluation  02/2006   stress test, echo, EKG-mild MVP   CHOLECYSTECTOMY, LAPAROSCOPIC  02/2007   COLONOSCOPY WITH PROPOFOL  N/A 08/03/2021   Procedure: COLONOSCOPY WITH PROPOFOL ;  Surgeon: Jinny Carmine, MD;  Location: [facility] ENDOSCOPY;  Service: Endoscopy;  Laterality: N/A;   excision of thyroid nodule  12/1998   EYE SURGERY Right 11/2014    OB History     Gravida  0   Para  0   Term  0   Preterm  0   AB  0   Living  0      SAB  0   IAB  0   Ectopic  0   Multiple  0   Live Births               Home Medications    Prior to Admission  medications  Medication Sig Start Date End Date Taking? Authorizing Provider  Biotin 10 MG CAPS Take by mouth daily.   Yes [provider]  Calcium Carbonate (CALCIUM 600 PO) Take by mouth daily.   Yes [provider]  Cholecalciferol 125 MCG (5000 UT) TABS Take by mouth daily.   Yes [provider]  Cyanocobalamin 1000 MCG TBCR Take by mouth daily.   Yes [provider]  ferrous sulfate 325 (65 FE) MG tablet Take by mouth. 09/19/18 12/09/24 Yes [provider]  finasteride (PROSCAR) 5 MG tablet Take by mouth. 02/03/20  Yes [provider]  FLUoxetine (PROZAC) 40 MG capsule 40 mg.  01/15/18  Yes [provider]  levothyroxine (SYNTHROID) 100 MCG tablet Take 100 mcg by mouth.  03/13/17 12/09/24 Yes [provider]  melatonin 1 MG TABS tablet Take 5 mg by mouth at bedtime.   Yes [provider]  Multiple Vitamin (MULTIVITAMIN) tablet Take 1 tablet by mouth daily.   Yes [provider]  Omega-3 Fatty Acids (FISH OIL PO) Take by mouth.   Yes  [provider]  omeprazole (PRILOSEC) 40 MG capsule Take by mouth. 06/27/16 12/09/24 Yes [provider]  pravastatin (PRAVACHOL) 20 MG tablet Take by mouth. 06/27/16 12/09/24 Yes [provider]    Family History Family History  Problem Relation Age of Onset   Alzheimer's disease Mother 75       died   CVA Mother    Heart attack Father    Kidney cancer Father    Hypertension Father    Hyperlipidemia Father    Cancer Father        reoccurred 03/19/13/mets to lungs, adrenal gland, and some lymph nodes   Breast cancer Neg Hx     Social History Social History[1]   Allergies   Codeine   Review of Systems Review of Systems  Musculoskeletal:  Positive for arthralgias and joint swelling.  Skin:  Positive for color change. Negative for wound.  Neurological:  Negative for weakness and numbness.     Physical Exam Triage Vital Signs ED Triage  Vitals  Encounter Vitals Group     BP 12/09/24 1200 104/69     Girls Systolic BP Percentile --      Girls Diastolic BP Percentile --      Boys Systolic BP Percentile --      Boys Diastolic BP Percentile --      Pulse Rate 12/09/24 1200 (!) 58     Resp 12/09/24 1200 16     Temp 12/09/24 1200 98.3 F (36.8 C)     Temp Source 12/09/24 1200 Oral     SpO2 12/09/24 1200 97 %     Weight --      Height --      Head Circumference --      Peak Flow --      Pain Score 12/09/24 1158 3     Pain Loc --      Pain Education --      Exclude from Growth Chart --    No data found.  Updated Vital Signs BP 104/69 (BP Location: Right Arm)   Pulse (!) 58   Temp 98.3 F (36.8 C) (Oral)   Resp 16   LMP 10/12/2014   SpO2 97%   Visual Acuity Right Eye Distance:   Left Eye Distance:   Bilateral Distance:    Right Eye Near:   Left Eye Near:    Bilateral Near:     Physical Exam Constitutional:      General: She is not in acute distress. HENT:     Mouth/Throat:     Mouth: Mucous membranes are moist.  Cardiovascular:     Rate and Rhythm: Normal rate.  Pulmonary:     Effort: Pulmonary effort is normal. No respiratory distress.  Musculoskeletal:        General: Swelling and tenderness present. No deformity. Normal range of motion.     Comments: Right wrist: tenderness, edema, ecchymosis.  No wounds. 2+ radial pulse, FROM, strength 5/5, strong handgrip, sensation intact.   Skin:    General: Skin is warm and dry.     Capillary Refill: Capillary refill takes less than 2 seconds.     Findings: Bruising present. No lesion.  Neurological:     General: No focal deficit present.     Mental Status: She is alert.     Sensory: No sensory deficit.     Motor: No weakness.      UC Treatments / Results  Labs (all labs ordered are listed, but only  abnormal results are displayed) Labs Reviewed - No data to display  EKG   Radiology DG Wrist Complete Right Result Date: 12/09/2024 CLINICAL  DATA:  Fall and trauma to the right wrist. EXAM: RIGHT WRIST - COMPLETE 3+ VIEW COMPARISON:  None Available. FINDINGS: Apparent slight cortical irregularity of the dorsum of the triquetrum. Clinical correlation recommended to evaluate for possibility of a nondisplaced triquetral fracture. No other acute fracture. No dislocation. The bones are osteopenic. There is soft tissue swelling of the wrist. No radiopaque foreign object or soft tissue gas. IMPRESSION: Possible nondisplaced triquetral fracture. Clinical correlation recommended. Electronically Signed   By: Vanetta Chou M.D.   On: 12/09/2024 13:10    Procedures Procedures (including critical care time)  Medications Ordered in UC Medications - No data to display  Initial Impression / Assessment and Plan / UC Course  I have reviewed the triage vital signs and the nursing notes.  Pertinent labs & imaging results that were available during my care of the patient were reviewed by me and considered in my medical decision making (see chart for details).   Right wrist pain.  Xray of right wrist  shows Possible nondisplaced triquetral fracture. Treating with sugar-tong splint, sling, rest, elevation, ice packs, Tylenol or ibuprofen as needed.  Education provided on wrist pain.  Instructed patient to follow-up with an orthopedist.  Contact information for on-call Ortho provided.  She agrees to plan of care.    Final Clinical Impressions(s) / UC Diagnoses   Final diagnoses:  Right wrist pain  Closed nondisplaced fracture of triquetrum of right wrist, initial encounter     Discharge Instructions      Wear the wrist splint and sling as directed.    As directed, take Tylenol or ibuprofen as needed, rest and elevate your wrist, apply ice packs.    Follow-up with an orthopedist such as the one listed below.     ED Prescriptions   None    PDMP not reviewed this encounter.     [1]  Social History Tobacco Use   Smoking status:  Never   Smokeless tobacco: Never  Vaping Use   Vaping status: Never Used  Substance Use Topics   Alcohol use: Not Currently    Comment: rarely   Drug use: No     Corlis Burnard DEL, NP 12/09/24 1354  "

## 2024-12-10 ENCOUNTER — Ambulatory Visit

## 2024-12-11 ENCOUNTER — Ambulatory Visit

## 2024-12-12 ENCOUNTER — Ambulatory Visit (HOSPITAL_COMMUNITY): Payer: Self-pay

## 2025-02-15 ENCOUNTER — Ambulatory Visit (HOSPITAL_BASED_OUTPATIENT_CLINIC_OR_DEPARTMENT_OTHER): Admitting: Obstetrics & Gynecology
